# Patient Record
Sex: Male | Born: 1955 | Race: Black or African American | Hispanic: No | Marital: Married | Smoking: Never smoker
Health system: Southern US, Community
[De-identification: ages and names within clinical notes are randomized; demographics above are authoritative.]

## PROBLEM LIST (undated history)

## (undated) DIAGNOSIS — K760 Fatty (change of) liver, not elsewhere classified: Secondary | ICD-10-CM

## (undated) DIAGNOSIS — I10 Essential (primary) hypertension: Secondary | ICD-10-CM

## (undated) DIAGNOSIS — K219 Gastro-esophageal reflux disease without esophagitis: Secondary | ICD-10-CM

## (undated) DIAGNOSIS — D649 Anemia, unspecified: Secondary | ICD-10-CM

## (undated) DIAGNOSIS — E785 Hyperlipidemia, unspecified: Secondary | ICD-10-CM

## (undated) HISTORY — DX: Gastro-esophageal reflux disease without esophagitis: K21.9

## (undated) HISTORY — DX: Hyperlipidemia, unspecified: E78.5

## (undated) HISTORY — DX: Anemia, unspecified: D64.9

## (undated) HISTORY — PX: OTHER SURGICAL HISTORY: SHX169

## (undated) HISTORY — DX: Fatty (change of) liver, not elsewhere classified: K76.0

## (undated) HISTORY — DX: Essential (primary) hypertension: I10

---

## 2017-08-10 ENCOUNTER — Telehealth: Payer: Self-pay | Admitting: Gastroenterology

## 2017-08-10 NOTE — Telephone Encounter (Signed)
Received GI records from the [place]. Dr. Havery Moros is Doc of the Day for 08/09/17 AM. Patient says that he is being referred to our office for a colonoscopy because they had a hard time putting scope down his throat. Records placed on Dr. Doyne Keel desk for review.

## 2017-09-09 NOTE — Telephone Encounter (Signed)
Dr. Havery Moros reviewed records and has accepted patient. Office visit scheduled for 10/07/17

## 2017-09-30 NOTE — Progress Notes (Signed)
NP paperwork

## 2017-10-07 ENCOUNTER — Ambulatory Visit (INDEPENDENT_AMBULATORY_CARE_PROVIDER_SITE_OTHER): Payer: No Typology Code available for payment source | Admitting: Gastroenterology

## 2017-10-07 ENCOUNTER — Encounter: Payer: Self-pay | Admitting: Gastroenterology

## 2017-10-07 ENCOUNTER — Other Ambulatory Visit (INDEPENDENT_AMBULATORY_CARE_PROVIDER_SITE_OTHER): Payer: No Typology Code available for payment source

## 2017-10-07 VITALS — BP 136/96 | HR 56 | Ht 66.5 in | Wt 219.2 lb

## 2017-10-07 DIAGNOSIS — D509 Iron deficiency anemia, unspecified: Secondary | ICD-10-CM | POA: Diagnosis not present

## 2017-10-07 DIAGNOSIS — K760 Fatty (change of) liver, not elsewhere classified: Secondary | ICD-10-CM | POA: Diagnosis not present

## 2017-10-07 DIAGNOSIS — Z1211 Encounter for screening for malignant neoplasm of colon: Secondary | ICD-10-CM

## 2017-10-07 DIAGNOSIS — K219 Gastro-esophageal reflux disease without esophagitis: Secondary | ICD-10-CM | POA: Diagnosis not present

## 2017-10-07 LAB — CBC WITH DIFFERENTIAL/PLATELET
BASOS PCT: 0.6 % (ref 0.0–3.0)
Basophils Absolute: 0 10*3/uL (ref 0.0–0.1)
EOS PCT: 4.1 % (ref 0.0–5.0)
Eosinophils Absolute: 0.2 10*3/uL (ref 0.0–0.7)
HCT: 42.1 % (ref 39.0–52.0)
HEMOGLOBIN: 13.6 g/dL (ref 13.0–17.0)
LYMPHS ABS: 1.6 10*3/uL (ref 0.7–4.0)
Lymphocytes Relative: 33.4 % (ref 12.0–46.0)
MCHC: 32.4 g/dL (ref 30.0–36.0)
MCV: 88.9 fl (ref 78.0–100.0)
MONO ABS: 0.4 10*3/uL (ref 0.1–1.0)
Monocytes Relative: 8.8 % (ref 3.0–12.0)
NEUTROS ABS: 2.6 10*3/uL (ref 1.4–7.7)
Neutrophils Relative %: 53.1 % (ref 43.0–77.0)
PLATELETS: 151 10*3/uL (ref 150.0–400.0)
RBC: 4.73 Mil/uL (ref 4.22–5.81)
RDW: 14.9 % (ref 11.5–15.5)
WBC: 4.9 10*3/uL (ref 4.0–10.5)

## 2017-10-07 LAB — FERRITIN: Ferritin: 57.9 ng/mL (ref 22.0–322.0)

## 2017-10-07 MED ORDER — SUPREP BOWEL PREP KIT 17.5-3.13-1.6 GM/177ML PO SOLN: ORAL | 0 refills | Status: DC

## 2017-10-07 NOTE — Patient Instructions (Addendum)
If you are age 63 or older, your body mass index should be between 23-30. Your Body mass index is 34.86 kg/m. If this is out of the aforementioned range listed, please consider follow up with your Primary Care Provider.  If you are age 61 or younger, your body mass index should be between 19-25. Your Body mass index is 34.86 kg/m. If this is out of the aformentioned range listed, please consider follow up with your Primary Care Provider.   You have been scheduled for a colonoscopy. Please follow written instructions given to you at your visit today.  Please pick up your prep supplies at the pharmacy within the next 1-3 days. If you use inhalers (even only as needed), please bring them with you on the day of your procedure. Your physician has requested that you go to www.startemmi.com and enter the access code given to you at your visit today. This web site gives a general overview about your procedure. However, you should still follow specific instructions given to you by our office regarding your preparation for the procedure.   Please go to the lab in the basement of our building to have lab work done as you leave today.   Thank you for entrusting me with your care and for choosing [facility], Dr. [place] Cellar

## 2017-10-07 NOTE — Progress Notes (Signed)
HPI :  62 year old male with a history of GERD, hypertension, hyperlipidemia, fatty liver, referred here by the [facility] for consideration of colonoscopy in light of the patient's anemia.  I don't have any access to prior labs done for this patient however it is reported that he has an iron deficiency ongoing for at least a few years. He denies any blood in his stools. He denies any bowel changes, he states his regular bowel movements without constipation or diarrhea. He denies any abdominal pains. He thinks his uncle may have had colon cancer but is not sure. Otherwise no known colon cancer in the family. He is never had a colonoscopy but has been screened with FIT stool test by the [place]. I don't have any results of prior FIT results.  He does have a long-standing history of heartburn. He is currently on pantoprazole 40 mg which currently controls his symptoms quite well. His prescription is for twice daily dosing however he states he does not take it twice a day, he will often take it only once per day. He denies any dysphagia at present although has had this in the past. He had an upper endoscopy performed in April of this year as outlined below, he had an irregular Z line - biopsies taken for which we don't have the results, he is not aware of any diagnosis of Barrett's esophagus or need to follow up endoscopically.  He otherwise was noted to have a CT scan in recent months showing a 3 cm liver lesion. His LFTs have been normal. He had a follow-up MRI at the [place] done in March 2019 which showed diffuse hepatic steatosis with focal area of marked fatty infiltration in the left lobe causing CT findings. He denies any history of alcohol use. He states his weight is stable around 215-220 pounds. He denies any family history of liver disease.  Endoscopic history: EGD 07/20/2017 - irregular z-line, small HH, normal stomach and duodenum   Past Medical History:  Diagnosis Date  . Anemia   . Fatty liver   . GERD  (gastroesophageal reflux disease)   . HLD (hyperlipidemia)   . HTN (hypertension)      Past Surgical History:  Procedure Laterality Date  . Tooth Implants      Family History  Problem Relation Age of Onset  . Cancer Mother        unknown type  . Diabetes Sister   . Asthma Sister   . Liver disease Sister   . Colon cancer Maternal Aunt    Social History   Tobacco Use  . Smoking status: Never Smoker  . Smokeless tobacco: Never Used  Substance Use Topics  . Alcohol use: Not Currently  . Drug use: Not on file   Current Outpatient Medications  Medication Sig Dispense Refill  . aspirin EC 81 MG tablet Take 81 mg by mouth daily.    Marland Kitchen atenolol (TENORMIN) 100 MG tablet Take 100 mg by mouth daily.    Marland Kitchen atorvastatin (LIPITOR) 20 MG tablet Take 20 mg by mouth at bedtime.    . chlorhexidine (PERIDEX) 0.12 % solution Use as directed 15 mLs in the mouth or throat 2 (two) times daily.    . enalapril (VASOTEC) 20 MG tablet Take 20 mg by mouth daily.    . pantoprazole (PROTONIX) 40 MG tablet Take 40 mg by mouth 2 (two) times daily before a meal.    . sildenafil (VIAGRA) 50 MG tablet Take 25 mg by mouth daily  as needed for erectile dysfunction.    Marland Kitchen terazosin (HYTRIN) 10 MG capsule Take 10 mg by mouth at bedtime.    Manus Gunning BOWEL PREP KIT 17.5-3.13-1.6 GM/177ML SOLN Suprep-Use as directed 354 mL 0   No current facility-administered medications for this visit.    Allergies no known allergies   Review of Systems: All systems reviewed and negative except where noted in HPI.    No results in Epic  Physical Exam: BP (!) 136/96 (BP Location: Left Arm, Patient Position: Sitting, Cuff Size: Large)   Pulse (!) 56   Ht 5' 6.5" (1.689 m) Comment: height measured without shoes  Wt 219 lb 4 oz (99.5 kg)   BMI 34.86 kg/m  Constitutional: Pleasant,well-developed, male in no acute distress. HEENT: Normocephalic and atraumatic. Conjunctivae are normal. No scleral icterus. Neck supple.    Cardiovascular: Normal rate, regular rhythm.  Pulmonary/chest: Effort normal and breath sounds normal. No wheezing, rales or rhonchi. Abdominal: Soft, nondistended, nontender.  There are no masses palpable. No hepatomegaly. Extremities: no edema Lymphadenopathy: No cervical adenopathy noted. Neurological: Alert and oriented to person place and time. Skin: Skin is warm and dry. No rashes noted. Psychiatric: Normal mood and affect. Behavior is normal.   ASSESSMENT AND PLAN: 62 year old male with medical history as outlined above, [facility] the following issues:  Reported iron deficiency / colon cancer screening - [facility], report of iron deficiency anemia ongoing for a few years. I don't see any prior labs so I don't know the severity of this. He's had a relatively recent upper endoscopy without any concerning pathology noted. He has never had a prior colonoscopy, and is asymptomatic. In light of his reported anemia, recommend optical colonoscopy to further evaluate. I discussed the risks and benefits of colonoscopy and anesthesia with him, and he wanted to proceed. Further recommendations pending this result. In the interim I asked him to go to the lab to check CBC and iron studies to clarify severity of his anemia and if he warrants iron supplementation. He agreed the plan  GERD - recent EGD, regular Z line noted but don't have results of pathology, he [facility] to confirm he does not have Barrett's esophagus. Otherwise continue low-dose dose of PPI needed to control his symptoms.  Fatty liver - diffuse steatosis noted on CT and MRI. His LFTs are normal. I counseled him this is a risk factor for Karlene Lineman and cirrhosis. He does not drink any alcohol. He should have his liver function checked yearly and focus on weight loss through diet and exercise to minimize his risk of developing liver disease. He agreed.  [place] Cellar, MD [facility]  Gastroenterology

## 2017-10-08 ENCOUNTER — Telehealth: Payer: Self-pay

## 2017-10-08 ENCOUNTER — Telehealth: Payer: Self-pay | Admitting: Gastroenterology

## 2017-10-08 LAB — IRON AND TIBC
Iron Saturation: 39 % (ref 15–55)
Iron: 112 ug/dL (ref 38–169)
Total Iron Binding Capacity: 286 ug/dL (ref 250–450)
UIBC: 174 ug/dL (ref 111–343)

## 2017-10-08 MED ORDER — SUPREP BOWEL PREP KIT 17.5-3.13-1.6 GM/177ML PO SOLN: ORAL | 0 refills | Status: DC

## 2017-10-08 NOTE — Telephone Encounter (Signed)
Sent to [facility] on [address]

## 2017-10-08 NOTE — Telephone Encounter (Signed)
-----   Message from Dow Adolph sent at 10/07/2017  4:29 PM EDT ----- Pt needs his script [facility] to [facility] on [address]

## 2017-10-08 NOTE — Telephone Encounter (Signed)
  Lm for pt that I was returning his call PN:[id number]

## 2017-10-11 ENCOUNTER — Other Ambulatory Visit: Payer: Self-pay

## 2017-10-11 ENCOUNTER — Telehealth: Payer: Self-pay | Admitting: Gastroenterology

## 2017-10-11 NOTE — Telephone Encounter (Signed)
See lab results.  No Rx needed

## 2017-10-11 NOTE — Telephone Encounter (Signed)
Spoke to [place], they wanted to clarify if patient was supposed to be also taking an iron supplement. Let her know that this will be based on his lab results. She states that if he does need a prescription to either fax it to their pharmacy at: [phone] or have patient come pick up/mail a written prescription to him. If he uses their pharmacy then he will receive it free of charge.

## 2017-10-11 NOTE — Telephone Encounter (Signed)
Pharmacy calling to inform that suprep is not cover. Insurance cover either moviprep or golytely.

## 2017-10-11 NOTE — Progress Notes (Signed)
Called the [place]. Can't speak to the pharmacy; only veterans can. Spoke to representative: they will not cover any brand prescriptions, including Suprep.  They want him to use something else like Golytely or MoviPrep, which he would not have to pay for.   Called and spoke to pt. Explained to him that Dr. Loni Muse uses Suprep b/c it allows him to visualize the colon better.  I told him I will leave a sample for him at the front desk.  He said he will pick it up tomorrow.  Also indicated he needs a letter about his office visit with Dr. Loni Muse on the 20th for reimbursement for travel. Letter printed and placed with Suprep sample to be picked up.

## 2017-10-11 NOTE — Telephone Encounter (Signed)
Left message to call back  

## 2017-10-18 NOTE — Telephone Encounter (Signed)
Camille from pharmacy calling in regarding this. Best # [phone] ext. [place]

## 2017-10-18 NOTE — Telephone Encounter (Signed)
Called Pharmacy.Cancelled RX.  Gave pt a  Sample last week

## 2017-11-22 ENCOUNTER — Encounter: Payer: Self-pay | Admitting: Gastroenterology

## 2017-11-24 ENCOUNTER — Encounter: Payer: Self-pay | Admitting: Gastroenterology

## 2017-11-24 ENCOUNTER — Ambulatory Visit (AMBULATORY_SURGERY_CENTER): Payer: No Typology Code available for payment source | Admitting: Gastroenterology

## 2017-11-24 VITALS — BP 158/95 | HR 60 | Temp 98.7°F | Resp 18 | Ht 66.5 in | Wt 219.2 lb

## 2017-11-24 DIAGNOSIS — Z1211 Encounter for screening for malignant neoplasm of colon: Secondary | ICD-10-CM

## 2017-11-24 DIAGNOSIS — D120 Benign neoplasm of cecum: Secondary | ICD-10-CM | POA: Diagnosis not present

## 2017-11-24 DIAGNOSIS — D509 Iron deficiency anemia, unspecified: Secondary | ICD-10-CM

## 2017-11-24 MED ORDER — SODIUM CHLORIDE 0.9 % IV SOLN: 500.0000 mL | Freq: Once | INTRAVENOUS | Status: DC

## 2017-11-24 NOTE — Patient Instructions (Signed)
YOU HAD AN ENDOSCOPIC PROCEDURE TODAY AT THE [facility]:   Refer to the procedure report that was given to you for any specific questions about what was found during the examination.  If the procedure report does not answer your questions, please call your gastroenterologist to clarify.  If you requested that your care partner not be given the details of your procedure findings, then the procedure report has been included in a sealed envelope for you to review at your convenience later.  YOU SHOULD EXPECT: Some feelings of bloating in the abdomen. Passage of more gas than usual.  Walking can help get rid of the air that was put into your GI tract during the procedure and reduce the bloating. If you had a lower endoscopy (such as a colonoscopy or flexible sigmoidoscopy) you may notice spotting of blood in your stool or on the toilet paper. If you underwent a bowel prep for your procedure, you may not have a normal bowel movement for a few days.  Please Note:  You might notice some irritation and congestion in your nose or some drainage.  This is from the oxygen used during your procedure.  There is no need for concern and it should clear up in a day or so.  SYMPTOMS TO REPORT IMMEDIATELY:   Following lower endoscopy (colonoscopy or flexible sigmoidoscopy):  Excessive amounts of blood in the stool  Significant tenderness or worsening of abdominal pains  Swelling of the abdomen that is new, acute  Fever of 100F or higher   For urgent or emergent issues, a gastroenterologist can be reached at any hour by calling [phone].   DIET:  We do recommend a small meal at first, but then you may proceed to your regular diet.  Drink plenty of fluids but you should avoid alcoholic beverages for 24 hours.  Try to increase the fiber in your diet, and drink plenty of water.  ACTIVITY:  You should plan to take it easy for the rest of today and you should NOT DRIVE or use heavy machinery until  tomorrow (because of the sedation medicines used during the test).    FOLLOW UP: Our staff will call the number listed on your records the next business day following your procedure to check on you and address any questions or concerns that you may have regarding the information given to you following your procedure. If we do not reach you, we will leave a message.  However, if you are feeling well and you are not experiencing any problems, there is no need to return our call.  We will assume that you have returned to your regular daily activities without incident.  If any biopsies were taken you will be contacted by phone or by letter within the next 1-3 weeks.  Please call us at [phone] if you have not heard about the biopsies in 3 weeks.    SIGNATURES/CONFIDENTIALITY: You and/or your care partner have signed paperwork which will be entered into your electronic medical record.  These signatures attest to the fact that that the information above on your After Visit Summary has been reviewed and is understood.  Full responsibility of the confidentiality of this discharge information lies with you and/or your care-partner. 

## 2017-11-24 NOTE — Progress Notes (Signed)
Called to room to assist during endoscopic procedure.  Patient ID and intended procedure confirmed with present staff. Received instructions for my participation in the procedure from the performing physician.  

## 2017-11-24 NOTE — Progress Notes (Signed)
Pt's states no medical or surgical changes since previsit or office visit. 

## 2017-11-24 NOTE — Op Note (Signed)
[place] Patient Name: Russell Prince Procedure Date: 11/24/2017 4:48 PM MRN: [id number] Endoscopist: Remo Lipps P. Havery Moros , MD Age: 62 Referring MD:  Date of Birth: Mar 11, 1956 Gender: Male Account #: [id number] Procedure:                Colonoscopy Indications:              This is the patient's first colonoscopy, history of                            iron deficiency anemia - resolved on most recent                            labs, prior EGD without cause Medicines:                Monitored Anesthesia Care Procedure:                Pre-Anesthesia Assessment:                           - Prior to the procedure, a History and Physical                            was performed, and patient medications and                            allergies were reviewed. The patient's tolerance of                            previous anesthesia was also reviewed. The risks                            and benefits of the procedure and the sedation                            options and risks were discussed with the patient.                            All questions were answered, and informed consent                            was obtained. Prior Anticoagulants: The patient has                            taken no previous anticoagulant or antiplatelet                            agents. ASA Grade Assessment: II - A patient with                            mild systemic disease. After reviewing the risks                            and benefits, the patient was deemed in  satisfactory condition to undergo the procedure.                           After obtaining informed consent, the colonoscope                            was passed under direct vision. Throughout the                            procedure, the patient's blood pressure, pulse, and                            oxygen saturations were monitored continuously. The                            Colonoscope was  introduced through the anus and                            advanced to the the terminal ileum, with                            identification of the appendiceal orifice and IC                            valve. The colonoscopy was performed without                            difficulty. The patient tolerated the procedure                            well. The quality of the bowel preparation was                            good. The ileocecal valve, appendiceal orifice, and                            rectum were photographed. Scope In: 4:55:20 PM Scope Out: 5:19:03 PM Scope Withdrawal Time: 0 hours 19 minutes 56 seconds  Total Procedure Duration: 0 hours 23 minutes 43 seconds  Findings:                 The perianal and digital rectal examinations were                            normal.                           The terminal ileum appeared normal.                           A 3 mm polyp was found in the cecum. The polyp was                            sessile. The polyp was removed with a cold snare.  Resection and retrieval were complete.                           A few medium-mouthed diverticula were found in the                            ascending colon.                           Internal hemorrhoids were found during retroflexion.                           The colon was tortous. The exam was otherwise                            without abnormality. Complications:            No immediate complications. Estimated blood loss:                            Minimal. Estimated Blood Loss:     Estimated blood loss was minimal. Impression:               - The examined portion of the ileum was normal.                           - One 3 mm polyp in the cecum, removed with a cold                            snare. Resected and retrieved.                           - Diverticulosis in the ascending colon.                           - Internal hemorrhoids.                            - Tortous colon                           - The examination was otherwise normal. Recommendation:           - Patient has a contact number available for                            emergencies. The signs and symptoms of potential                            delayed complications were discussed with the                            patient. Return to normal activities tomorrow.                            Written discharge instructions were provided to the  patient.                           - Resume previous diet.                           - Continue present medications.                           - Await pathology results.                           - Repeat colonoscopy for surveillance based on                            pathology results. Remo Lipps P. [place], MD 11/24/2017 5:24:06 PM This report has been signed electronically.

## 2017-11-25 ENCOUNTER — Telehealth: Payer: Self-pay

## 2017-11-25 NOTE — Telephone Encounter (Signed)
  Follow up Call-  Call back number 11/24/2017  Post procedure Call Back phone  # [phone]  Permission to leave phone message Yes     Patient questions:  Do you have a fever, pain , or abdominal swelling? No. Pain Score  0 *  Have you tolerated food without any problems? Yes.    Have you been able to return to your normal activities? Yes.    Do you have any questions about your discharge instructions: Diet   No. Medications  No. Follow up visit  No.  Do you have questions or concerns about your Care? No.  Actions: * If pain score is 4 or above: No action needed, pain <4.

## 2017-12-27 ENCOUNTER — Encounter: Payer: Self-pay | Admitting: Family Medicine

## 2018-05-02 ENCOUNTER — Emergency Department (HOSPITAL_COMMUNITY)
Admission: EM | Admit: 2018-05-02 | Discharge: 2018-05-03 | Disposition: A | Discharge status: Home / Self Care | Payer: Non-veteran care | Attending: Emergency Medicine | Admitting: Emergency Medicine

## 2018-05-02 ENCOUNTER — Other Ambulatory Visit: Payer: Self-pay

## 2018-05-02 ENCOUNTER — Encounter (HOSPITAL_COMMUNITY): Payer: Self-pay

## 2018-05-02 DIAGNOSIS — R1011 Right upper quadrant pain: Secondary | ICD-10-CM | POA: Insufficient documentation

## 2018-05-02 DIAGNOSIS — Z5321 Procedure and treatment not carried out due to patient leaving prior to being seen by health care provider: Secondary | ICD-10-CM | POA: Insufficient documentation

## 2018-05-02 LAB — URINALYSIS, ROUTINE W REFLEX MICROSCOPIC
BILIRUBIN URINE: NEGATIVE
GLUCOSE, UA: NEGATIVE mg/dL
Hgb urine dipstick: NEGATIVE
KETONES UR: NEGATIVE mg/dL
LEUKOCYTES UA: NEGATIVE
NITRITE: NEGATIVE
PH: 5 (ref 5.0–8.0)
Protein, ur: NEGATIVE mg/dL
SPECIFIC GRAVITY, URINE: 1.016 (ref 1.005–1.030)

## 2018-05-02 NOTE — ED Triage Notes (Signed)
Pt states he has been having right sided flank pain for approximately 2 weeks.  No issues with urination.

## 2018-05-02 NOTE — ED Notes (Signed)
Pt called to be placed in room. No answer.

## 2018-05-03 NOTE — ED Notes (Signed)
Called for room x3 no answer °

## 2018-05-03 NOTE — ED Notes (Signed)
Called to be placed. No answer

## 2018-05-08 ENCOUNTER — Encounter (HOSPITAL_COMMUNITY): Payer: Self-pay | Admitting: Emergency Medicine

## 2018-05-08 ENCOUNTER — Emergency Department (HOSPITAL_COMMUNITY)
Admission: EM | Admit: 2018-05-08 | Discharge: 2018-05-08 | Disposition: A | Discharge status: Home / Self Care | Payer: No Typology Code available for payment source | Attending: Emergency Medicine | Admitting: Emergency Medicine

## 2018-05-08 ENCOUNTER — Emergency Department (HOSPITAL_COMMUNITY): Payer: No Typology Code available for payment source

## 2018-05-08 DIAGNOSIS — Y939 Activity, unspecified: Secondary | ICD-10-CM | POA: Diagnosis not present

## 2018-05-08 DIAGNOSIS — Z7982 Long term (current) use of aspirin: Secondary | ICD-10-CM | POA: Insufficient documentation

## 2018-05-08 DIAGNOSIS — T148XXA Other injury of unspecified body region, initial encounter: Secondary | ICD-10-CM

## 2018-05-08 DIAGNOSIS — E785 Hyperlipidemia, unspecified: Secondary | ICD-10-CM | POA: Diagnosis not present

## 2018-05-08 DIAGNOSIS — Y33XXXA Other specified events, undetermined intent, initial encounter: Secondary | ICD-10-CM | POA: Insufficient documentation

## 2018-05-08 DIAGNOSIS — Y998 Other external cause status: Secondary | ICD-10-CM | POA: Insufficient documentation

## 2018-05-08 DIAGNOSIS — Z79899 Other long term (current) drug therapy: Secondary | ICD-10-CM | POA: Diagnosis not present

## 2018-05-08 DIAGNOSIS — R1084 Generalized abdominal pain: Secondary | ICD-10-CM | POA: Diagnosis present

## 2018-05-08 DIAGNOSIS — Y929 Unspecified place or not applicable: Secondary | ICD-10-CM | POA: Diagnosis not present

## 2018-05-08 DIAGNOSIS — I10 Essential (primary) hypertension: Secondary | ICD-10-CM | POA: Insufficient documentation

## 2018-05-08 DIAGNOSIS — S39012A Strain of muscle, fascia and tendon of lower back, initial encounter: Secondary | ICD-10-CM | POA: Insufficient documentation

## 2018-05-08 LAB — URINALYSIS, ROUTINE W REFLEX MICROSCOPIC
Bilirubin Urine: NEGATIVE
GLUCOSE, UA: NEGATIVE mg/dL
Hgb urine dipstick: NEGATIVE
KETONES UR: NEGATIVE mg/dL
Leukocytes, UA: NEGATIVE
NITRITE: NEGATIVE
PH: 6 (ref 5.0–8.0)
PROTEIN: NEGATIVE mg/dL
Specific Gravity, Urine: 1.006 (ref 1.005–1.030)

## 2018-05-08 MED ORDER — MORPHINE SULFATE (PF) 4 MG/ML IV SOLN
6.0000 mg | Freq: Once | INTRAVENOUS | Status: AC
  Administered 2018-05-08: 6 mg via INTRAMUSCULAR
  Filled 2018-05-08: qty 2

## 2018-05-08 MED ORDER — METHOCARBAMOL 750 MG PO TABS: 750.0000 mg | ORAL_TABLET | Freq: Four times a day (QID) | ORAL | 0 refills | Status: DC

## 2018-05-08 MED ORDER — HYDROCODONE-ACETAMINOPHEN 5-325 MG PO TABS: 2.0000 | ORAL_TABLET | ORAL | 0 refills | Status: DC | PRN

## 2018-05-08 MED ORDER — DIAZEPAM 5 MG PO TABS
10.0000 mg | ORAL_TABLET | Freq: Once | ORAL | Status: AC
Start: 2018-05-08 — End: 2018-05-08
  Administered 2018-05-08: 10 mg via ORAL
  Filled 2018-05-08: qty 2

## 2018-05-08 NOTE — ED Notes (Signed)
Patient transported to CT 

## 2018-05-08 NOTE — ED Triage Notes (Signed)
Pt here having right side flank pain /low back pain for 2 weeks

## 2018-05-08 NOTE — ED Provider Notes (Signed)
[facility] EMERGENCY DEPARTMENT Provider Note   CSN: [id number] Arrival date & time: 05/08/18  1315     History   Chief Complaint No chief complaint on file.   HPI Russell Prince is a 63 y.o. male.  63 year old male presents with 2 weeks of right-sided flank pain characterizes dull and worse with movement.  Denies any radicular symptoms down his right leg.  No bowel or bladder dysfunction.  Denies any dysuria or hematuria.  No fever or chills.  No nausea vomiting.  Pain is sharp and better with remaining still.  Has used over-the-counter medications without relief.  Denies any rashes to the skin.     Past Medical History:  Diagnosis Date  . Anemia   . Fatty liver   . GERD (gastroesophageal reflux disease)   . HLD (hyperlipidemia)   . HTN (hypertension)     There are no active problems to display for this patient.   Past Surgical History:  Procedure Laterality Date  . Tooth Implants           Home Medications    Prior to Admission medications   Medication Sig Start Date End Date Taking? Authorizing Provider  aspirin EC 81 MG tablet Take 81 mg by mouth daily.    [provider]  atenolol (TENORMIN) 100 MG tablet Take 100 mg by mouth daily.    [provider]  atorvastatin (LIPITOR) 20 MG tablet Take 20 mg by mouth at bedtime.    [provider]  chlorhexidine (PERIDEX) 0.12 % solution Use as directed 15 mLs in the mouth or throat 2 (two) times daily.    [provider]  enalapril (VASOTEC) 20 MG tablet Take 20 mg by mouth daily.    [provider]  pantoprazole (PROTONIX) 40 MG tablet Take 40 mg by mouth 2 (two) times daily before a meal.    [provider]  sildenafil (VIAGRA) 50 MG tablet Take 25 mg by mouth daily as needed for erectile dysfunction.    [provider]  terazosin (HYTRIN) 10 MG capsule Take 10 mg by mouth at bedtime.    [provider]    Family  History Family History  Problem Relation Age of Onset  . Cancer Mother        unknown type  . Diabetes Sister   . Asthma Sister   . Liver disease Sister   . Colon cancer Maternal Uncle   . Stomach cancer Neg Hx   . Esophageal cancer Neg Hx     Social History Social History   Tobacco Use  . Smoking status: Never Smoker  . Smokeless tobacco: Never Used  Substance Use Topics  . Alcohol use: Never    Frequency: Never  . Drug use: Never     Allergies   Patient has no known allergies.   Review of Systems Review of Systems  All other systems reviewed and are negative.    Physical Exam Updated Vital Signs BP (!) 187/104 (BP Location: Right Arm)   Pulse (!) 50   Temp 97.8 F (36.6 C) (Oral)   Resp 16   SpO2 99%   Physical Exam Vitals signs and nursing note reviewed.  Constitutional:      General: He is not in acute distress.    Appearance: Normal appearance. He is well-developed. He is not toxic-appearing.  HENT:     Head: Normocephalic and atraumatic.  Eyes:     General: Lids are normal.  Conjunctiva/sclera: Conjunctivae normal.     Pupils: Pupils are equal, round, and reactive to light.  Neck:     Musculoskeletal: Normal range of motion and neck supple.     Thyroid: No thyroid mass.     Trachea: No tracheal deviation.  Cardiovascular:     Rate and Rhythm: Normal rate and regular rhythm.     Heart sounds: Normal heart sounds. No murmur. No gallop.   Pulmonary:     Effort: Pulmonary effort is normal. No respiratory distress.     Breath sounds: Normal breath sounds. No stridor. No decreased breath sounds, wheezing, rhonchi or rales.  Abdominal:     General: Bowel sounds are normal. There is no distension.     Palpations: Abdomen is soft.     Tenderness: There is no abdominal tenderness. There is no rebound.  Musculoskeletal: Normal range of motion.     Lumbar back: He exhibits tenderness. He exhibits no bony tenderness and no swelling.        Back:  Skin:    General: Skin is warm and dry.     Findings: No abrasion or rash.  Neurological:     Mental Status: He is alert and oriented to person, place, and time.     GCS: GCS eye subscore is 4. GCS verbal subscore is 5. GCS motor subscore is 6.     Cranial Nerves: No cranial nerve deficit.     Sensory: No sensory deficit.  Psychiatric:        Speech: Speech normal.        Behavior: Behavior normal.      ED Treatments / Results  Labs (all labs ordered are listed, but only abnormal results are displayed) Labs Reviewed  URINALYSIS, ROUTINE W REFLEX MICROSCOPIC    EKG None  Radiology No results found.  Procedures Procedures (including critical care time)  Medications Ordered in ED Medications  diazepam (VALIUM) tablet 10 mg (has no administration in time range)  morphine 4 MG/ML injection 6 mg (has no administration in time range)     Initial Impression / Assessment and Plan / ED Course  I have reviewed the triage vital signs and the nursing notes.  Pertinent labs & imaging results that were available during my care of the patient were reviewed by me and considered in my medical decision making (see chart for details).     Patient medicated for pain here and now feels better.  Urinalysis negative.  CT scan negative.  Suspect musculoskeletal strain and patient stable for discharge  Final Clinical Impressions(s) / ED Diagnoses   Final diagnoses:  None    ED Discharge Orders    None       Lacretia Leigh, MD 05/08/18 1436

## 2018-05-31 ENCOUNTER — Telehealth: Payer: Self-pay

## 2018-05-31 DIAGNOSIS — D509 Iron deficiency anemia, unspecified: Secondary | ICD-10-CM

## 2018-05-31 NOTE — Progress Notes (Signed)
Pt due for labs - CBC for IDA

## 2018-05-31 NOTE — Telephone Encounter (Signed)
-----   Message from Roetta Sessions, Brielle sent at 12/01/2017  4:25 PM EDT ----- Regarding: cbc due in Feb Cbc due in Feb 2020. For IDA

## 2018-05-31 NOTE — Telephone Encounter (Signed)
Lm for pt that it he needs to go to the lab for CBC to monitor IDA.  Order is in

## 2018-06-01 NOTE — Telephone Encounter (Signed)
Letter mailed to pt to go to the lab for CBC

## 2018-06-06 ENCOUNTER — Other Ambulatory Visit (INDEPENDENT_AMBULATORY_CARE_PROVIDER_SITE_OTHER): Payer: Non-veteran care

## 2018-06-06 DIAGNOSIS — D509 Iron deficiency anemia, unspecified: Secondary | ICD-10-CM

## 2018-06-06 LAB — CBC WITH DIFFERENTIAL/PLATELET
Basophils Absolute: 0 10*3/uL (ref 0.0–0.1)
Basophils Relative: 0.4 % (ref 0.0–3.0)
EOS PCT: 3.7 % (ref 0.0–5.0)
Eosinophils Absolute: 0.2 10*3/uL (ref 0.0–0.7)
HCT: 44.2 % (ref 39.0–52.0)
Hemoglobin: 14.8 g/dL (ref 13.0–17.0)
Lymphocytes Relative: 23.9 % (ref 12.0–46.0)
Lymphs Abs: 1.4 10*3/uL (ref 0.7–4.0)
MCHC: 33.4 g/dL (ref 30.0–36.0)
MCV: 87.6 fl (ref 78.0–100.0)
Monocytes Absolute: 0.7 10*3/uL (ref 0.1–1.0)
Monocytes Relative: 12.3 % — ABNORMAL HIGH (ref 3.0–12.0)
NEUTROS ABS: 3.6 10*3/uL (ref 1.4–7.7)
Neutrophils Relative %: 59.7 % (ref 43.0–77.0)
Platelets: 166 10*3/uL (ref 150.0–400.0)
RBC: 5.05 Mil/uL (ref 4.22–5.81)
RDW: 14 % (ref 11.5–15.5)
WBC: 6 10*3/uL (ref 4.0–10.5)

## 2018-06-07 ENCOUNTER — Encounter: Payer: Self-pay | Admitting: Gastroenterology

## 2019-04-30 ENCOUNTER — Emergency Department (HOSPITAL_COMMUNITY): Payer: No Typology Code available for payment source

## 2019-04-30 ENCOUNTER — Inpatient Hospital Stay (HOSPITAL_COMMUNITY)
Admission: EM | Admit: 2019-04-30 | Discharge: 2019-05-03 | DRG: 177 | Disposition: A | Discharge status: Home / Self Care | Payer: No Typology Code available for payment source | Attending: Family Medicine | Admitting: Family Medicine

## 2019-04-30 ENCOUNTER — Other Ambulatory Visit: Payer: Self-pay

## 2019-04-30 ENCOUNTER — Encounter (HOSPITAL_COMMUNITY): Payer: Self-pay | Admitting: Emergency Medicine

## 2019-04-30 DIAGNOSIS — K219 Gastro-esophageal reflux disease without esophagitis: Secondary | ICD-10-CM | POA: Diagnosis present

## 2019-04-30 DIAGNOSIS — Z79899 Other long term (current) drug therapy: Secondary | ICD-10-CM | POA: Diagnosis not present

## 2019-04-30 DIAGNOSIS — K760 Fatty (change of) liver, not elsewhere classified: Secondary | ICD-10-CM | POA: Diagnosis present

## 2019-04-30 DIAGNOSIS — R0602 Shortness of breath: Secondary | ICD-10-CM

## 2019-04-30 DIAGNOSIS — U071 COVID-19: Principal | ICD-10-CM

## 2019-04-30 DIAGNOSIS — E785 Hyperlipidemia, unspecified: Secondary | ICD-10-CM | POA: Diagnosis present

## 2019-04-30 DIAGNOSIS — J1282 Pneumonia due to coronavirus disease 2019: Secondary | ICD-10-CM | POA: Diagnosis present

## 2019-04-30 DIAGNOSIS — N400 Enlarged prostate without lower urinary tract symptoms: Secondary | ICD-10-CM | POA: Diagnosis present

## 2019-04-30 DIAGNOSIS — N179 Acute kidney failure, unspecified: Secondary | ICD-10-CM | POA: Diagnosis present

## 2019-04-30 DIAGNOSIS — I160 Hypertensive urgency: Secondary | ICD-10-CM | POA: Diagnosis present

## 2019-04-30 DIAGNOSIS — J9601 Acute respiratory failure with hypoxia: Secondary | ICD-10-CM | POA: Diagnosis present

## 2019-04-30 DIAGNOSIS — K5900 Constipation, unspecified: Secondary | ICD-10-CM | POA: Diagnosis present

## 2019-04-30 DIAGNOSIS — E876 Hypokalemia: Secondary | ICD-10-CM | POA: Diagnosis present

## 2019-04-30 DIAGNOSIS — Z7982 Long term (current) use of aspirin: Secondary | ICD-10-CM | POA: Diagnosis not present

## 2019-04-30 DIAGNOSIS — R7401 Elevation of levels of liver transaminase levels: Secondary | ICD-10-CM

## 2019-04-30 DIAGNOSIS — I10 Essential (primary) hypertension: Secondary | ICD-10-CM | POA: Diagnosis present

## 2019-04-30 LAB — URINALYSIS, ROUTINE W REFLEX MICROSCOPIC
Bacteria, UA: NONE SEEN
Bilirubin Urine: NEGATIVE
Glucose, UA: NEGATIVE mg/dL
Hgb urine dipstick: NEGATIVE
Ketones, ur: 20 mg/dL — AB
Leukocytes,Ua: NEGATIVE
Nitrite: NEGATIVE
Protein, ur: 100 mg/dL — AB
Specific Gravity, Urine: 1.019 (ref 1.005–1.030)
pH: 6 (ref 5.0–8.0)

## 2019-04-30 LAB — FIBRINOGEN: Fibrinogen: 657 mg/dL — ABNORMAL HIGH (ref 210–475)

## 2019-04-30 LAB — LACTIC ACID, PLASMA: Lactic Acid, Venous: 1.3 mmol/L (ref 0.5–1.9)

## 2019-04-30 LAB — COMPREHENSIVE METABOLIC PANEL
ALT: 22 U/L (ref 0–44)
AST: 45 U/L — ABNORMAL HIGH (ref 15–41)
Albumin: 3.1 g/dL — ABNORMAL LOW (ref 3.5–5.0)
Alkaline Phosphatase: 66 U/L (ref 38–126)
Anion gap: 10 (ref 5–15)
BUN: 9 mg/dL (ref 8–23)
CO2: 29 mmol/L (ref 22–32)
Calcium: 8.7 mg/dL — ABNORMAL LOW (ref 8.9–10.3)
Chloride: 99 mmol/L (ref 98–111)
Creatinine, Ser: 1.48 mg/dL — ABNORMAL HIGH (ref 0.61–1.24)
GFR calc Af Amer: 57 mL/min — ABNORMAL LOW (ref >60)
GFR calc non Af Amer: 49 mL/min — ABNORMAL LOW (ref >60)
Glucose, Bld: 105 mg/dL — ABNORMAL HIGH (ref 70–99)
Potassium: 3.4 mmol/L — ABNORMAL LOW (ref 3.5–5.1)
Sodium: 138 mmol/L (ref 135–145)
Total Bilirubin: 0.7 mg/dL (ref 0.3–1.2)
Total Protein: 7.6 g/dL (ref 6.5–8.1)

## 2019-04-30 LAB — CBC WITH DIFFERENTIAL/PLATELET
Abs Immature Granulocytes: 0.02 10*3/uL (ref 0.00–0.07)
Basophils Absolute: 0 10*3/uL (ref 0.0–0.1)
Basophils Relative: 0 %
Eosinophils Absolute: 0 10*3/uL (ref 0.0–0.5)
Eosinophils Relative: 1 %
HCT: 48.2 % (ref 39.0–52.0)
Hemoglobin: 15.4 g/dL (ref 13.0–17.0)
Immature Granulocytes: 1 %
Lymphocytes Relative: 21 %
Lymphs Abs: 0.8 10*3/uL (ref 0.7–4.0)
MCH: 28.3 pg (ref 26.0–34.0)
MCHC: 32 g/dL (ref 30.0–36.0)
MCV: 88.4 fL (ref 80.0–100.0)
Monocytes Absolute: 0.4 10*3/uL (ref 0.1–1.0)
Monocytes Relative: 9 %
Neutro Abs: 2.8 10*3/uL (ref 1.7–7.7)
Neutrophils Relative %: 68 %
Platelets: 199 10*3/uL (ref 150–400)
RBC: 5.45 MIL/uL (ref 4.22–5.81)
RDW: 13.5 % (ref 11.5–15.5)
WBC: 4.1 10*3/uL (ref 4.0–10.5)
nRBC: 0 % (ref 0.0–0.2)

## 2019-04-30 LAB — TROPONIN I (HIGH SENSITIVITY)
Troponin I (High Sensitivity): 10 ng/L (ref <18)
Troponin I (High Sensitivity): 10 ng/L (ref <18)

## 2019-04-30 LAB — TYPE AND SCREEN
ABO/RH(D): O POS
Antibody Screen: NEGATIVE

## 2019-04-30 LAB — HIV ANTIBODY (ROUTINE TESTING W REFLEX): HIV Screen 4th Generation wRfx: NONREACTIVE

## 2019-04-30 LAB — RESPIRATORY PANEL BY RT PCR (FLU A&B, COVID)
Influenza A by PCR: NEGATIVE
Influenza B by PCR: NEGATIVE
SARS Coronavirus 2 by RT PCR: POSITIVE — AB

## 2019-04-30 LAB — C-REACTIVE PROTEIN: CRP: 15.9 mg/dL — ABNORMAL HIGH (ref <1.0)

## 2019-04-30 LAB — LIPASE, BLOOD: Lipase: 25 U/L (ref 11–51)

## 2019-04-30 LAB — POC SARS CORONAVIRUS 2 AG -  ED: SARS Coronavirus 2 Ag: NEGATIVE

## 2019-04-30 LAB — TRIGLYCERIDES: Triglycerides: 99 mg/dL (ref <150)

## 2019-04-30 LAB — PROCALCITONIN: Procalcitonin: <0.1 ng/mL

## 2019-04-30 LAB — FERRITIN: Ferritin: 572 ng/mL — ABNORMAL HIGH (ref 24–336)

## 2019-04-30 LAB — D-DIMER, QUANTITATIVE: D-Dimer, Quant: 1.61 ug/mL-FEU — ABNORMAL HIGH (ref 0.00–0.50)

## 2019-04-30 LAB — HEPATITIS B SURFACE ANTIGEN: Hepatitis B Surface Ag: NONREACTIVE

## 2019-04-30 LAB — BRAIN NATRIURETIC PEPTIDE: B Natriuretic Peptide: 35.8 pg/mL (ref 0.0–100.0)

## 2019-04-30 LAB — ABO/RH: ABO/RH(D): O POS

## 2019-04-30 LAB — LACTATE DEHYDROGENASE: LDH: 519 U/L — ABNORMAL HIGH (ref 98–192)

## 2019-04-30 MED ORDER — SODIUM CHLORIDE 0.9 % IV SOLN
200.0000 mg | Freq: Once | INTRAVENOUS | Status: AC
  Administered 2019-04-30: 200 mg via INTRAVENOUS
  Filled 2019-04-30: qty 40

## 2019-04-30 MED ORDER — POTASSIUM CHLORIDE CRYS ER 20 MEQ PO TBCR
40.0000 meq | EXTENDED_RELEASE_TABLET | ORAL | Status: AC
  Administered 2019-04-30: 40 meq via ORAL
  Filled 2019-04-30: qty 2

## 2019-04-30 MED ORDER — ASCORBIC ACID 500 MG PO TABS
500.0000 mg | ORAL_TABLET | Freq: Every day | ORAL | Status: DC
  Administered 2019-04-30 – 2019-05-03 (×4): 500 mg via ORAL
  Filled 2019-04-30 (×4): qty 1

## 2019-04-30 MED ORDER — ALBUTEROL SULFATE HFA 108 (90 BASE) MCG/ACT IN AERS
2.0000 | INHALATION_SPRAY | Freq: Four times a day (QID) | RESPIRATORY_TRACT | Status: DC
  Administered 2019-05-01 – 2019-05-03 (×10): 2 via RESPIRATORY_TRACT
  Filled 2019-04-30 (×2): qty 6.7

## 2019-04-30 MED ORDER — ALBUTEROL SULFATE HFA 108 (90 BASE) MCG/ACT IN AERS: 2.0000 | INHALATION_SPRAY | Freq: Once | RESPIRATORY_TRACT | Status: DC

## 2019-04-30 MED ORDER — SODIUM CHLORIDE 0.9 % IV SOLN: INTRAVENOUS | Status: DC

## 2019-04-30 MED ORDER — DEXAMETHASONE SODIUM PHOSPHATE 10 MG/ML IJ SOLN
6.0000 mg | INTRAMUSCULAR | Status: DC
  Administered 2019-04-30 – 2019-05-01 (×2): 6 mg via INTRAVENOUS
  Filled 2019-04-30 (×2): qty 1

## 2019-04-30 MED ORDER — ACETAMINOPHEN 325 MG PO TABS
650.0000 mg | ORAL_TABLET | Freq: Four times a day (QID) | ORAL | Status: DC | PRN
  Administered 2019-05-02 (×2): 650 mg via ORAL
  Filled 2019-04-30: qty 2

## 2019-04-30 MED ORDER — AEROCHAMBER PLUS FLO-VU LARGE MISC
Status: AC
  Filled 2019-04-30: qty 1

## 2019-04-30 MED ORDER — HYDROCOD POLST-CPM POLST ER 10-8 MG/5ML PO SUER
5.0000 mL | Freq: Two times a day (BID) | ORAL | Status: DC | PRN
  Administered 2019-05-01: 5 mL via ORAL
  Filled 2019-04-30: qty 5

## 2019-04-30 MED ORDER — ONDANSETRON HCL 4 MG PO TABS: 4.0000 mg | ORAL_TABLET | Freq: Four times a day (QID) | ORAL | Status: DC | PRN

## 2019-04-30 MED ORDER — SODIUM CHLORIDE 0.9% FLUSH
3.0000 mL | Freq: Two times a day (BID) | INTRAVENOUS | Status: DC
  Administered 2019-05-01 – 2019-05-03 (×5): 3 mL via INTRAVENOUS

## 2019-04-30 MED ORDER — ENOXAPARIN SODIUM 40 MG/0.4ML ~~LOC~~ SOLN
40.0000 mg | SUBCUTANEOUS | Status: DC
  Administered 2019-04-30 – 2019-05-02 (×3): 40 mg via SUBCUTANEOUS
  Filled 2019-04-30 (×3): qty 0.4

## 2019-04-30 MED ORDER — SODIUM CHLORIDE 0.9 % IV BOLUS
250.0000 mL | Freq: Once | INTRAVENOUS | Status: AC
  Administered 2019-04-30: 250 mL via INTRAVENOUS

## 2019-04-30 MED ORDER — ONDANSETRON HCL 4 MG/2ML IJ SOLN: 4.0000 mg | Freq: Four times a day (QID) | INTRAMUSCULAR | Status: DC | PRN

## 2019-04-30 MED ORDER — GUAIFENESIN-DM 100-10 MG/5ML PO SYRP
10.0000 mL | ORAL_SOLUTION | ORAL | Status: DC | PRN
  Administered 2019-05-01 (×2): 10 mL via ORAL
  Filled 2019-04-30 (×2): qty 10

## 2019-04-30 MED ORDER — TERAZOSIN HCL 5 MG PO CAPS
10.0000 mg | ORAL_CAPSULE | Freq: Every day | ORAL | Status: DC
  Administered 2019-05-01 – 2019-05-02 (×3): 10 mg via ORAL
  Filled 2019-04-30 (×5): qty 2

## 2019-04-30 MED ORDER — HYDROCOD POLST-CPM POLST ER 10-8 MG/5ML PO SUER
5.0000 mL | Freq: Once | ORAL | Status: AC
  Administered 2019-04-30: 5 mL via ORAL
  Filled 2019-04-30: qty 5

## 2019-04-30 MED ORDER — ZINC SULFATE 220 (50 ZN) MG PO CAPS
220.0000 mg | ORAL_CAPSULE | Freq: Every day | ORAL | Status: DC
  Administered 2019-04-30 – 2019-05-03 (×4): 220 mg via ORAL
  Filled 2019-04-30 (×4): qty 1

## 2019-04-30 MED ORDER — PANTOPRAZOLE SODIUM 40 MG PO TBEC
40.0000 mg | DELAYED_RELEASE_TABLET | Freq: Two times a day (BID) | ORAL | Status: DC
  Administered 2019-04-30 – 2019-05-03 (×6): 40 mg via ORAL
  Filled 2019-04-30 (×6): qty 1

## 2019-04-30 MED ORDER — ENALAPRIL MALEATE 20 MG PO TABS
20.0000 mg | ORAL_TABLET | Freq: Once | ORAL | Status: DC
  Filled 2019-04-30: qty 1

## 2019-04-30 MED ORDER — SODIUM CHLORIDE 0.9 % IV SOLN
100.0000 mg | Freq: Every day | INTRAVENOUS | Status: DC
  Administered 2019-05-01: 100 mg via INTRAVENOUS
  Filled 2019-04-30: qty 20

## 2019-04-30 MED ORDER — ATENOLOL 100 MG PO TABS
100.0000 mg | ORAL_TABLET | Freq: Every day | ORAL | Status: DC
  Administered 2019-04-30 – 2019-05-03 (×4): 100 mg via ORAL
  Filled 2019-04-30 (×3): qty 1
  Filled 2019-04-30: qty 2

## 2019-04-30 MED ORDER — ALBUTEROL SULFATE HFA 108 (90 BASE) MCG/ACT IN AERS
6.0000 | INHALATION_SPRAY | Freq: Once | RESPIRATORY_TRACT | Status: AC
  Administered 2019-04-30: 6 via RESPIRATORY_TRACT
  Filled 2019-04-30: qty 6.7

## 2019-04-30 NOTE — ED Triage Notes (Signed)
Pt to triage by [facility].  EMS states pt diagnosed with COVID 7 days ago pt states symptoms have been going on for 2-3 weeks.  Reports increased SOB, body aches, and chills.

## 2019-04-30 NOTE — ED Provider Notes (Signed)
[facility] EMERGENCY DEPARTMENT Provider Note   CSN: [id number] Arrival date & time: 04/30/19  1009     History Chief Complaint  Patient presents with  . COVID +  . Shortness of Breath    Russell Prince is a 64 y.o. male.  HPI      Russell Prince is a 64 y.o. male, with a history of HTN and hyperlipidemia, presenting to the ED with cough and shortness of breath for the last 2 weeks. States he tested positive for Covid this past week.  Accompanied by chills, fatigue, and generalized body aches.  He has had generalized abdominal cramping with coughing. He has been drinking fluids, but has had poor appetite. Denies fever, focal abdominal pain, chest pain, N/V/D, urinary symptoms, or any other complaints.   Past Medical History:  Diagnosis Date  . Anemia   . Fatty liver   . GERD (gastroesophageal reflux disease)   . HLD (hyperlipidemia)   . HTN (hypertension)     Patient Active Problem List   Diagnosis Date Noted  . Pneumonia due to COVID-19 virus 04/30/2019    Past Surgical History:  Procedure Laterality Date  . Tooth Implants          Family History  Problem Relation Age of Onset  . Cancer Mother        unknown type  . Diabetes Sister   . Asthma Sister   . Liver disease Sister   . Colon cancer Maternal Uncle   . Stomach cancer Neg Hx   . Esophageal cancer Neg Hx     Social History   Tobacco Use  . Smoking status: Never Smoker  . Smokeless tobacco: Never Used  Substance Use Topics  . Alcohol use: Never  . Drug use: Never    Home Medications Prior to Admission medications   Medication Sig Start Date End Date Taking? Authorizing Provider  aspirin EC 81 MG tablet Take 81 mg by mouth daily.   Yes [provider]  atenolol (TENORMIN) 100 MG tablet Take 100 mg by mouth daily.   Yes [provider]  enalapril (VASOTEC) 20 MG tablet Take 20 mg by mouth daily.   Yes [provider]  pantoprazole (PROTONIX)  40 MG tablet Take 40 mg by mouth 2 (two) times daily before a meal.   Yes [provider]  sildenafil (VIAGRA) 50 MG tablet Take 25 mg by mouth daily as needed for erectile dysfunction.   Yes [provider]  terazosin (HYTRIN) 10 MG capsule Take 10 mg by mouth at bedtime.   Yes [provider]  HYDROcodone-acetaminophen (NORCO/VICODIN) 5-325 MG tablet Take 2 tablets by mouth every 4 (four) hours as needed. Patient not taking: Reported on 04/30/2019 05/08/18   Lacretia Leigh, MD  methocarbamol (ROBAXIN-750) 750 MG tablet Take 1 tablet (750 mg total) by mouth 4 (four) times daily. Patient not taking: Reported on 04/30/2019 05/08/18   Lacretia Leigh, MD    Allergies    Patient has no known allergies.  Review of Systems   Review of Systems  Constitutional: Positive for chills and fatigue.  Respiratory: Positive for cough and shortness of breath.   Cardiovascular: Negative for chest pain and leg swelling.  Gastrointestinal: Negative for abdominal pain, diarrhea, nausea and vomiting.  Genitourinary: Negative for dysuria.  Musculoskeletal: Positive for myalgias.  Neurological: Positive for weakness (generalized). Negative for syncope.  All other systems reviewed and are negative.   Physical Exam Updated Vital Signs BP (!) 163/124 (  BP Location: Right Arm)   Pulse (!) 122   Temp 98.8 F (37.1 C) (Oral)   Resp (!) 32   SpO2 94%   Physical Exam Vitals and nursing note reviewed.  Constitutional:      General: He is in acute distress.     Appearance: He is well-developed. He is ill-appearing. He is not diaphoretic.  HENT:     Head: Normocephalic and atraumatic.     Mouth/Throat:     Mouth: Mucous membranes are moist.     Pharynx: Oropharynx is clear.  Eyes:     Conjunctiva/sclera: Conjunctivae normal.  Cardiovascular:     Rate and Rhythm: Regular rhythm. Tachycardia present.     Pulses: Normal pulses.          Radial pulses are 2+ on the right side and 2+ on  the left side.       Posterior tibial pulses are 2+ on the right side and 2+ on the left side.     Heart sounds: Normal heart sounds.     Comments: Tactile temperature in the extremities appropriate and equal bilaterally. Pulmonary:     Effort: Tachypnea and respiratory distress present.     Breath sounds: Normal breath sounds.     Comments: Tachypneic with some increased work of breathing.  Conversational dyspnea. Abdominal:     Palpations: Abdomen is soft.     Tenderness: There is no abdominal tenderness. There is no guarding.  Musculoskeletal:     Cervical back: Neck supple.     Right lower leg: No edema.     Left lower leg: No edema.  Lymphadenopathy:     Cervical: No cervical adenopathy.  Skin:    General: Skin is warm and dry.  Neurological:     Mental Status: He is alert.  Psychiatric:        Mood and Affect: Mood and affect normal.        Speech: Speech normal.        Behavior: Behavior normal.     ED Results / Procedures / Treatments   Labs (all labs ordered are listed, but only abnormal results are displayed) Labs Reviewed  COMPREHENSIVE METABOLIC PANEL - Abnormal; Notable for the following components:      Result Value   Potassium 3.4 (*)    Glucose, Bld 105 (*)    Creatinine, Ser 1.48 (*)    Calcium 8.7 (*)    Albumin 3.1 (*)    AST 45 (*)    GFR calc non Af Amer 49 (*)    GFR calc Af Amer 57 (*)    All other components within normal limits  URINALYSIS, ROUTINE W REFLEX MICROSCOPIC - Abnormal; Notable for the following components:   Ketones, ur 20 (*)    Protein, ur 100 (*)    All other components within normal limits  CULTURE, BLOOD (ROUTINE X 2)  CULTURE, BLOOD (ROUTINE X 2)  RESPIRATORY PANEL BY RT PCR (FLU A&B, COVID)  LIPASE, BLOOD  CBC WITH DIFFERENTIAL/PLATELET  LACTIC ACID, PLASMA  LACTIC ACID, PLASMA  D-DIMER, QUANTITATIVE (NOT AT [facility])  PROCALCITONIN  LACTATE DEHYDROGENASE  FERRITIN  TRIGLYCERIDES  FIBRINOGEN  C-REACTIVE PROTEIN  HIV  ANTIBODY (ROUTINE TESTING W REFLEX)  HEPATITIS B SURFACE ANTIGEN  BRAIN NATRIURETIC PEPTIDE  POC SARS CORONAVIRUS 2 AG -  ED  ABO/RH  TYPE AND SCREEN  TROPONIN I (HIGH SENSITIVITY)  TROPONIN I (HIGH SENSITIVITY)    EKG EKG Interpretation  Date/Time:  Sunday April 30 2019 10:16:10 EST Ventricular Rate:  126 PR Interval:  148 QRS Duration: 70 QT Interval:  300 QTC Calculation: 434 R Axis:   7 Text Interpretation: Sinus tachycardia Nonspecific T wave abnormality Abnormal ECG No previous ECGs available Confirmed by Gareth Morgan [phone]) on 04/30/2019 12:12:52 PM   Radiology DG Chest Portable 1 View  Result Date: 04/30/2019 CLINICAL DATA:  Cough, dyspnea, COVID-19 positive EXAM: PORTABLE CHEST 1 VIEW COMPARISON:  None. FINDINGS: Low lung volumes. Normal heart size. Normal mediastinal contour. No pneumothorax. No pleural effusion. Mild hazy left lung base opacity. No pulmonary edema. IMPRESSION: Low lung volumes. Mild hazy left lung base opacity, which could represent pneumonia or atelectasis. Electronically Signed   By: Ilona Sorrel M.D.   On: 04/30/2019 12:15    Procedures Procedures (including critical care time)  Medications Ordered in ED Medications  atenolol (TENORMIN) tablet 100 mg (has no administration in time range)  terazosin (HYTRIN) capsule 10 mg (has no administration in time range)  pantoprazole (PROTONIX) EC tablet 40 mg (has no administration in time range)  enoxaparin (LOVENOX) injection 40 mg (has no administration in time range)  sodium chloride flush (NS) 0.9 % injection 3 mL (has no administration in time range)  albuterol (VENTOLIN HFA) 108 (90 Base) MCG/ACT inhaler 2 puff (has no administration in time range)  dexamethasone (DECADRON) injection 6 mg (has no administration in time range)  guaiFENesin-dextromethorphan (ROBITUSSIN DM) 100-10 MG/5ML syrup 10 mL (has no administration in time range)  chlorpheniramine-HYDROcodone (TUSSIONEX) 10-8 MG/5ML  suspension 5 mL (has no administration in time range)  ascorbic acid (VITAMIN C) tablet 500 mg (has no administration in time range)  zinc sulfate capsule 220 mg (has no administration in time range)  acetaminophen (TYLENOL) tablet 650 mg (has no administration in time range)  ondansetron (ZOFRAN) tablet 4 mg (has no administration in time range)    Or  ondansetron (ZOFRAN) injection 4 mg (has no administration in time range)  potassium chloride SA (KLOR-CON) CR tablet 40 mEq (has no administration in time range)  0.9 %  sodium chloride infusion (has no administration in time range)  sodium chloride 0.9 % bolus 250 mL (0 mLs Intravenous Stopped 04/30/19 1256)  albuterol (VENTOLIN HFA) 108 (90 Base) MCG/ACT inhaler 6 puff (6 puffs Inhalation Given 04/30/19 1218)  chlorpheniramine-HYDROcodone (TUSSIONEX) 10-8 MG/5ML suspension 5 mL (5 mLs Oral Given 04/30/19 1319)  AeroChamber Plus Flo-Vu Large MISC (  Given 04/30/19 1550)    ED Course  I have reviewed the triage vital signs and the nursing notes.  Pertinent labs & imaging results that were available during my care of the patient were reviewed by me and considered in my medical decision making (see chart for details).  Clinical Course as of Apr 29 1600  Sun Apr 30, 2019  1230 Spoke with patient's wife.  Diagnosed with COVID at [facility] January 6. Symptoms started Dec 29. Started with cough and shortness of breath. "His color just looked bad. He looks gray more and more." Wife endorses complaints by the patient of chest pain, headache, vomiting.  Patient has also intermittently had fever with T-max 103 F. Has been drinking plenty of fluids, but has not been eating that last couple days.  EMS was call two days ago, but decided to decline transport due to concerns over wait times in the ED. She also adds that a spot was noted in one of his lungs by the [facility] March 2020. She does not remember any further details.   [  SJ]  1419 Spoke with Dr.  Tamala Julian, hospitalist. Agrees to admit the patient.   [SJ]    Clinical Course User Index [SJ] Benjermin Korber, Helane Gunther, PA-C   MDM Rules/Calculators/A&P                         Patient presents with shortness of breath and cough.  Patient is nontoxic appearing, however, is ill-appearing.  Tachycardic, tachypneic, and conversationally dyspneic. I suspect the symptoms to be related to COVID-19 infection.  SPO2 as low as 89% on room air. An AKI is possible, though I do not have a previous creatinine or BUN with which to compare the patient's current value. Due to the patient's presentation, I do suspect patient would benefit from admission. Covid specific labs ordered, but results pending at time of admission.   Findings and plan of care discussed with Gareth Morgan, MD.   Russell Prince was evaluated in Emergency Department on 04/30/2019 for the symptoms described in the history of present illness. He was evaluated in the context of the global COVID-19 pandemic, which necessitated consideration that the patient might be at risk for infection with the SARS-CoV-2 virus that causes COVID-19. Institutional protocols and algorithms that pertain to the evaluation of patients at risk for COVID-19 are in a state of rapid change based on information released by regulatory bodies including the CDC and federal and state organizations. These policies and algorithms were followed during the patient's care in the ED.    Final Clinical Impression(s) / ED Diagnoses Final diagnoses:  COVID-19  Shortness of breath    Rx / DC Orders ED Discharge Orders    None       Layla Maw 04/30/19 1603    Gareth Morgan, MD 05/03/19 1523

## 2019-04-30 NOTE — H&P (Signed)
History and Physical    Russell Prince [id number] DOB: 01-02-1956 DOA: 04/30/2019  Referring MD/NP/PA: Santiago Bur, PA-C PCP: Henreitta Cea, MD  Patient coming from: Home  Chief Complaint: Cough and shortness of breath.  I have personally briefly reviewed patient's old medical records in [place]   HPI: Russell Prince is a 64 y.o. male with medical history significant of hypertension, hyperlipidemia, GERD, and anemia.  Patient presents with complaints of progressively worsening cough and shortness of breath over the last 2 weeks.  He goes into these coughing spells where he is unable to catch his breath.  He was diagnosed at the [facility] hospital earlier in the week with COVID-19 on 1/6. Associated symptoms of chills, diaphoresis, generalized body aches, abdominal pain/cramps secondary to coughing, and decreased intake of food.  He has been trying to drink plenty of fluids.  Denies having nausea, vomiting, or diarrhea symptoms.   ED Course: On admission into the emergency department patient was noted to be afebrile, pulse 103-1 22, respiration 24-34, blood pressures elevated up to 196/106, and O2 saturations noted to be as low as 89% on room air with improvement to 98% on 2 L.  COVID-19 screening was positive.  Labs significant for potassium 3.4, BUN 9, creatinine 1.48, and AST 45.  Chest x-ray noted low lung volumes with mild left lung base opacity concerning for pneumonia versus atelectasis.  Review of Systems  Constitutional: Positive for chills, diaphoresis and malaise/fatigue.  HENT: Negative for ear discharge and sinus pain.   Eyes: Negative for photophobia and pain.  Respiratory: Positive for shortness of breath.   Cardiovascular: Negative for chest pain and leg swelling.  Gastrointestinal: Positive for abdominal pain. Negative for diarrhea, nausea and vomiting.  Genitourinary: Negative for dysuria and hematuria.  Musculoskeletal: Positive for myalgias. Negative for falls.   Skin: Negative for rash.  Neurological: Positive for weakness. Negative for loss of consciousness.  Psychiatric/Behavioral: Negative for memory loss and substance abuse.    Past Medical History:  Diagnosis Date  . Anemia   . Fatty liver   . GERD (gastroesophageal reflux disease)   . HLD (hyperlipidemia)   . HTN (hypertension)     Past Surgical History:  Procedure Laterality Date  . Tooth Implants        reports that he has never smoked. He has never used smokeless tobacco. He reports that he does not drink alcohol or use drugs.  No Known Allergies  Family History  Problem Relation Age of Onset  . Cancer Mother        unknown type  . Diabetes Sister   . Asthma Sister   . Liver disease Sister   . Colon cancer Maternal Uncle   . Stomach cancer Neg Hx   . Esophageal cancer Neg Hx     Prior to Admission medications   Medication Sig Start Date End Date Taking? Authorizing Provider  aspirin EC 81 MG tablet Take 81 mg by mouth daily.   Yes [provider]  atenolol (TENORMIN) 100 MG tablet Take 100 mg by mouth daily.   Yes [provider]  enalapril (VASOTEC) 20 MG tablet Take 20 mg by mouth daily.   Yes [provider]  pantoprazole (PROTONIX) 40 MG tablet Take 40 mg by mouth 2 (two) times daily before a meal.   Yes [provider]  sildenafil (VIAGRA) 50 MG tablet Take 25 mg by mouth daily as needed for erectile dysfunction.   Yes [provider]  terazosin (HYTRIN)  10 MG capsule Take 10 mg by mouth at bedtime.   Yes [provider]  HYDROcodone-acetaminophen (NORCO/VICODIN) 5-325 MG tablet Take 2 tablets by mouth every 4 (four) hours as needed. Patient not taking: Reported on 04/30/2019 05/08/18   Lacretia Leigh, MD  methocarbamol (ROBAXIN-750) 750 MG tablet Take 1 tablet (750 mg total) by mouth 4 (four) times daily. Patient not taking: Reported on 04/30/2019 05/08/18   Lacretia Leigh, MD    Physical  Exam:  Constitutional: Older male who appears to be uncomfortable coughing Vitals:   04/30/19 1130 04/30/19 1145 04/30/19 1200 04/30/19 1300  BP: (!) 162/129 (!) 161/124 (!) 162/127 (!) 160/122  Pulse: (!) 107 (!) 106 (!) 105 (!) 103  Resp: (!) 24 (!) 25 (!) 28 (!) 25  Temp:      TempSrc:      SpO2: 96% 97% 98% 96%   Eyes: PERRL, lids and conjunctivae normal ENMT: Mucous membranes are dry. Posterior pharynx clear of any exudate or lesions.  Neck: normal, supple, no masses, no thyromegaly Respiratory:   Tachypneic with decreased aeration and crackles heard at the left lung base.  No significant wheezing.  Patient currently on 2 L of oxygen with O2 saturations currently maintained. Cardiovascular: Tachycardia, no murmurs / rubs / gallops. No extremity edema. 2+ pedal pulses. No carotid bruits.  Abdomen: Mild tenderness musculature of the abdomen, no masses palpated. No hepatosplenomegaly. Bowel sounds positive.  Musculoskeletal: no clubbing / cyanosis. No joint deformity upper and lower extremities. Good ROM, no contractures. Normal muscle tone.  Skin: Diaphoretic. Neurologic: CN 2-12 grossly intact. Sensation intact, DTR normal. Strength 5/5 in all 4.  Psychiatric: Normal judgment and insight. Alert and oriented x 3. Normal mood.     Labs on Admission: I have personally reviewed following labs and imaging studies  CBC: Recent Labs  Lab 04/30/19 1141  WBC 4.1  NEUTROABS 2.8  HGB 15.4  HCT 48.2  MCV 88.4  PLT 123XX123   Basic Metabolic Panel: Recent Labs  Lab 04/30/19 1141  NA 138  K 3.4*  CL 99  CO2 29  GLUCOSE 105*  BUN 9  CREATININE 1.48*  CALCIUM 8.7*   GFR: CrCl cannot be calculated (Unknown ideal weight.). Liver Function Tests: Recent Labs  Lab 04/30/19 1141  AST 45*  ALT 22  ALKPHOS 66  BILITOT 0.7  PROT 7.6  ALBUMIN 3.1*   Recent Labs  Lab 04/30/19 1141  LIPASE 25   No results for input(s): AMMONIA in the last 168 hours. Coagulation Profile: No  results for input(s): INR, PROTIME in the last 168 hours. Cardiac Enzymes: No results for input(s): CKTOTAL, CKMB, CKMBINDEX, TROPONINI in the last 168 hours. BNP (last 3 results) No results for input(s): PROBNP in the last 8760 hours. HbA1C: No results for input(s): HGBA1C in the last 72 hours. CBG: No results for input(s): GLUCAP in the last 168 hours. Lipid Profile: No results for input(s): CHOL, HDL, LDLCALC, TRIG, CHOLHDL, LDLDIRECT in the last 72 hours. Thyroid Function Tests: No results for input(s): TSH, T4TOTAL, FREET4, T3FREE, THYROIDAB in the last 72 hours. Anemia Panel: No results for input(s): VITAMINB12, FOLATE, FERRITIN, TIBC, IRON, RETICCTPCT in the last 72 hours. Urine analysis:    Component Value Date/Time   COLORURINE YELLOW 04/30/2019 1325   APPEARANCEUR CLEAR 04/30/2019 1325   LABSPEC 1.019 04/30/2019 1325   PHURINE 6.0 04/30/2019 1325   GLUCOSEU NEGATIVE 04/30/2019 1325   HGBUR NEGATIVE 04/30/2019 [place] 04/30/2019 1325   KETONESUR  20 (A) 04/30/2019 1325   PROTEINUR 100 (A) 04/30/2019 1325   NITRITE NEGATIVE 04/30/2019 1325   LEUKOCYTESUR NEGATIVE 04/30/2019 1325   Sepsis Labs: No results found for this or any previous visit (from the past 240 hour(s)).   Radiological Exams on Admission: DG Chest Portable 1 View  Result Date: 04/30/2019 CLINICAL DATA:  Cough, dyspnea, COVID-19 positive EXAM: PORTABLE CHEST 1 VIEW COMPARISON:  None. FINDINGS: Low lung volumes. Normal heart size. Normal mediastinal contour. No pneumothorax. No pleural effusion. Mild hazy left lung base opacity. No pulmonary edema. IMPRESSION: Low lung volumes. Mild hazy left lung base opacity, which could represent pneumonia or atelectasis. Electronically Signed   By: Ilona Sorrel M.D.   On: 04/30/2019 12:15    EKG: Independently reviewed.  Sinus tachycardia 124 bpm.  Assessment/Plan Acute respiratory failure with hypoxia secondary to pneumonia due to COVID-19:  Patient presents with signs of progressively worsening shortness of breath and cough.  Diagnosed with COVID-19 earlier this week.  O2 saturations as low as 89% requiring 2 L of nasal cannula oxygen to maintain saturation greater than 90%. Chest x-ray showing possible signs of left lower lobe pneumonia.  -Admit to a medical telemetry bed -COVID-19 admission order set utilized -Continuous pulse oximetry with nasal cannula oxygen to maintain O2 saturation greater than 90% -Follow-up blood culture -Albuterol inhaler -Antitussives as needed -Vitamin C and zinc -Remdesivir per pharmacy -Check inflammatory markers stat and continue to monitor daily  Suspected acute kidney injury: Creatinine 1.48 and BUN 9 on admission, but no previous kidney function labs to compare.  Patient denies any previous history of being told that he had kidney problems -Normal saline IV fluids 75 mL/h -Recheck kidney function in  Hypertensive urgency: Acute.  Patient's blood pressures noted to be elevated up to 186/106 on admission.  Suspect at least in part associated with his acute illness. -Continue atenolol -Hold vasotec due to suspected AKI   Hypokalemia: Acute.  Initial potassium mildly low at 3.4. -Give 40 mEq of potassium chloride x1 dose now -Continue to monitor and replace as needed   Elevated AST: AST mildly elevated at 45.  -Recheck CMP in a.m.  BPH -Continue Terazosin  DVT prophylaxis: Lovenox Code Status: Full Family Communication: Discussed plan of care with the patient's wife over the phone Disposition Plan: Possible discharge home when medically stable Consults called: None Admission status: Inpatient  Norval Morton MD [facility] Pager [phone]   If 7PM-7AM, please contact night-coverage [email or web address] Password [facility]  04/30/2019, 3:51 PM

## 2019-04-30 NOTE — ED Notes (Signed)
RN assisted this pt with urinal, pt began coughing, unable to catch his breath, sats ranged between 93-94% on 2L [place], HR increased to 142, RR 44. EDP and EDPA made aware

## 2019-04-30 NOTE — ED Notes (Signed)
First lactec acid normal

## 2019-05-01 DIAGNOSIS — J1282 Pneumonia due to coronavirus disease 2019: Secondary | ICD-10-CM

## 2019-05-01 DIAGNOSIS — N179 Acute kidney failure, unspecified: Secondary | ICD-10-CM

## 2019-05-01 DIAGNOSIS — J9601 Acute respiratory failure with hypoxia: Secondary | ICD-10-CM

## 2019-05-01 DIAGNOSIS — N400 Enlarged prostate without lower urinary tract symptoms: Secondary | ICD-10-CM

## 2019-05-01 DIAGNOSIS — U071 COVID-19: Principal | ICD-10-CM

## 2019-05-01 DIAGNOSIS — I160 Hypertensive urgency: Secondary | ICD-10-CM

## 2019-05-01 LAB — TYPE AND SCREEN
ABO/RH(D): O POS
Antibody Screen: NEGATIVE

## 2019-05-01 LAB — ABO/RH: ABO/RH(D): O POS

## 2019-05-01 MED ORDER — SODIUM CHLORIDE 0.9% IV SOLUTION: Freq: Once | INTRAVENOUS | Status: AC

## 2019-05-01 MED ORDER — DEXAMETHASONE SODIUM PHOSPHATE 10 MG/ML IJ SOLN
6.0000 mg | Freq: Two times a day (BID) | INTRAMUSCULAR | Status: DC
  Administered 2019-05-01 – 2019-05-02 (×3): 6 mg via INTRAVENOUS
  Filled 2019-05-01 (×3): qty 1

## 2019-05-01 MED ORDER — ENSURE ENLIVE PO LIQD
237.0000 mL | Freq: Three times a day (TID) | ORAL | Status: DC
  Administered 2019-05-01 – 2019-05-03 (×6): 237 mL via ORAL

## 2019-05-01 MED ORDER — ORAL CARE MOUTH RINSE
15.0000 mL | Freq: Two times a day (BID) | OROMUCOSAL | Status: DC
  Administered 2019-05-01 – 2019-05-03 (×5): 15 mL via OROMUCOSAL

## 2019-05-01 NOTE — Progress Notes (Signed)
PROGRESS NOTE  Russell Prince  VXB:[id number] DOB: January 17, 1956 DOA: 04/30/2019 PCP: Henreitta Cea, MD   Brief Narrative: Russell Prince is a 65 y.o. male with a history of HTN, HLD, GERD, and anemia who presented with coughing and worsening dyspnea for the previous 2 weeks, having been diagnosed with covid-19 at the [place] 1/6. In the ED on 1/10 he was tachycardic, tachypneic, hypertensive and hypoxic requiring 2L O2. SARS-CoV-2 confirmed to be positive. Chest x-ray noted low lung volumes with mild left lung base opacity concerning for pneumonia versus atelectasis. Remdesivir and steroids were given, patient admitted to [facility].   Assessment & Plan: Principal Problem:   Pneumonia due to COVID-19 virus Active Problems:   Acute respiratory failure with hypoxia (HCC)   BPH (benign prostatic hyperplasia)   AKI (acute kidney injury) ([place])   Hypertensive urgency  Acute hypoxemic respiratory failure due to covid-19 pneumonia: SARS-CoV-2 PCR positive reportedly 1/6, confirmed 1/10 with negative Ag at that time.  - Continue remdesivir x5 days (1/10 - 1/14) - Steroids x10 days, augment dose today. With elevated CRP but only mild hypoxia, will recommend CCP in lieu of tocilizumab, though this off label medication was discussed at length with inclusion criteria being met and exclusion criteria not present, the patient does consent to administration in the event of advancing hypoxia. The patient and his wife both consent to CCP today.  - Vitamin C, zinc - Encourage OOB, IS, FV, and awake proning if able - Tylenol and antitussives prn - Continue airborne, contact precautions while admitted. Isolation period would be recommended for 21 days from positive testing. - Check CBC w/diff, CMP, CRP daily - Enoxaparin prophylactic dose.  - Maintain euvolemia/net negative.  - Avoid NSAIDs   Suspected acute kidney injury: Creatinine 1.48 and BUN 9 on admission, but no previous kidney function labs to compare.  Patient  denies any previous history of being told that he had kidney problems - Push po fluids, can stop IVF.  - Monitor renal function tomorrow  Hypertensive urgency: Acute, improving.  Patient's blood pressures noted to be elevated up to 186/106 on admission.  Suspect at least in part associated with his acute illness. -Continue atenolol -Hold vasotec due to suspected AKI   Hypokalemia: Mild - Continue monitoring in AM   Elevated AST: AST mildly elevated at 45.  - Recheck CMP tomorrow   BPH -Continue terazosin  Constipation:  - Start bowel regimen  DVT prophylaxis: Lovenox Code Status: Full Family Communication: Wife by phone at length Disposition Plan: Uncertain   Consultants:   None  Procedures:   CCP  Antimicrobials:  Remdesivir   Subjective: Shortness of breath at rest is stable, mild, improved with oxygen, worse with exertion to the point of being severe and limiting ambulation. No chest pain unless coughing. Gets coughing spells that are persistent and severe.  Objective: Vitals:   05/01/19 0751 05/01/19 0906 05/01/19 1103 05/01/19 1623  BP: (!) 137/102  128/86 (!) 142/101  Pulse: 74   70  Resp: _0 (!) 22  Temp: 97.7 F (36.5 C) (!) 97.4 F (36.3 C) (!) 97.2 F (36.2 C) 97.6 F (36.4 C)  TempSrc: Oral  Axillary Oral  SpO2: 94%   94%  Weight:      Height:        Intake/Output Summary (Last 24 hours) at 05/01/2019 1659 Last data filed at 05/01/2019 0926 Gross per 24 hour  Intake 1083.83 ml  Output 200 ml  Net 883.83 ml   Danley Danker  Weights   05/01/19 0400  Weight: 88.3 kg    Gen: 64 y.o. male in no distress Pulm: Non-labored w/supplemental oxygen at rest. Crackles at L base  CV: Regular rate and rhythm. No murmur, rub, or gallop. No JVD, no pedal edema. GI: Abdomen soft, non-tender, non-distended, with normoactive bowel sounds. No organomegaly or masses felt. Ext: Warm, no deformities Skin: No rashes, lesions no ulcers Neuro: Alert and  oriented, foggy sensorium. No focal neurological deficits. Psych: Judgement and insight appear normal. Mood & affect appropriate.   Data Reviewed: I have personally reviewed following labs and imaging studies  CBC: Recent Labs  Lab 04/30/19 1141  WBC 4.1  NEUTROABS 2.8  HGB 15.4  HCT 48.2  MCV 88.4  PLT 151   Basic Metabolic Panel: Recent Labs  Lab 04/30/19 1141  NA 138  K 3.4*  CL 99  CO2 29  GLUCOSE 105*  BUN 9  CREATININE 1.48*  CALCIUM 8.7*   GFR: Estimated Creatinine Clearance: 55 mL/min (A) (by C-G formula based on SCr of 1.48 mg/dL (H)). Liver Function Tests: Recent Labs  Lab 04/30/19 1141  AST 45*  ALT 22  ALKPHOS 66  BILITOT 0.7  PROT 7.6  ALBUMIN 3.1*   Recent Labs  Lab 04/30/19 1141  LIPASE 25   Anemia Panel: Recent Labs    04/30/19 1230  FERRITIN 572*   Urine analysis:    Component Value Date/Time   COLORURINE YELLOW 04/30/2019 1325   APPEARANCEUR CLEAR 04/30/2019 1325   LABSPEC 1.019 04/30/2019 1325   PHURINE 6.0 04/30/2019 1325   GLUCOSEU NEGATIVE 04/30/2019 1325   HGBUR NEGATIVE 04/30/2019 1325   [place] 04/30/2019 1325   KETONESUR 20 (A) 04/30/2019 1325   PROTEINUR 100 (A) 04/30/2019 1325   NITRITE NEGATIVE 04/30/2019 1325   LEUKOCYTESUR NEGATIVE 04/30/2019 1325   Recent Results (from the past 240 hour(s))  Blood Culture (routine x 2)     Status: None (Preliminary result)   Collection Time: 04/30/19  1:15 PM   Specimen: BLOOD  Result Value Ref Range Status   Specimen Description BLOOD LEFT ANTECUBITAL  Final   Special Requests   Final    BOTTLES DRAWN AEROBIC AND ANAEROBIC Blood Culture adequate volume   Culture   Final    NO GROWTH < 24 HOURS Performed at [facility], [place] [address], [place], [place] [address]    Report Status PENDING  Incomplete  Blood Culture (routine x 2)     Status: None (Preliminary result)   Collection Time: 04/30/19  3:50 PM   Specimen: BLOOD  Result Value Ref Range Status    Specimen Description BLOOD RIGHT ANTECUBITAL  Final   Special Requests   Final    BOTTLES DRAWN AEROBIC AND ANAEROBIC Blood Culture adequate volume   Culture   Final    NO GROWTH < 24 HOURS Performed at [facility], [place] [address], [place], [place] [address]    Report Status PENDING  Incomplete  Respiratory Panel by RT PCR (Flu A&B, Covid) - Nasopharyngeal Swab     Status: Abnormal   Collection Time: 04/30/19  3:58 PM   Specimen: Nasopharyngeal Swab  Result Value Ref Range Status   SARS Coronavirus 2 by RT PCR POSITIVE (A) NEGATIVE Final    Comment: RESULT CALLED TO, READ BACK BY AND VERIFIED WITH: Benard Halsted, ED RN AT 1914 ON 04/30/19 BY C. JESSUP, MT. (NOTE) SARS-CoV-2 target nucleic acids are DETECTED. SARS-CoV-2 RNA is generally detectable in  upper respiratory specimens  during the acute phase of infection. Positive results are indicative of the presence of the identified virus, but do not rule out bacterial infection or co-infection with other pathogens not detected by the test. Clinical correlation with patient history and other diagnostic information is necessary to determine patient infection status. The expected result is Negative. Fact Sheet for Patients:  PinkCheek.be Fact Sheet for Healthcare Providers: GravelBags.it This test is not yet approved or cleared by the [place] FDA and  has been authorized for detection and/or diagnosis of SARS-CoV-2 by FDA under an Emergency Use Authorization (EUA).  This EUA will remain in effect (meaning this te st can be used) for the duration of  the COVID-19 declaration under Section 564(b)(1) of the Act, 21 U.S.C. section 360bbb-3(b)(1), unless the authorization is terminated or revoked sooner.    Influenza A by PCR NEGATIVE NEGATIVE Final   Influenza B by PCR NEGATIVE NEGATIVE Final    Comment: (NOTE) The Xpert Xpress SARS-CoV-2/FLU/RSV assay is intended as an  aid in  the diagnosis of influenza from Nasopharyngeal swab specimens and  should not be used as a sole basis for treatment. Nasal washings and  aspirates are unacceptable for Xpert Xpress SARS-CoV-2/FLU/RSV  testing. Fact Sheet for Patients: PinkCheek.be Fact Sheet for Healthcare Providers: GravelBags.it This test is not yet approved or cleared by the [place] FDA and  has been authorized for detection and/or diagnosis of SARS-CoV-2 by  FDA under an Emergency Use Authorization (EUA). This EUA will remain  in effect (meaning this test can be used) for the duration of the  Covid-19 declaration under Section 564(b)(1) of the Act, 21  U.S.C. section 360bbb-3(b)(1), unless the authorization is  terminated or revoked. Performed at [facility], [place] [address], [place], [place] [address]       Radiology Studies: DG Chest Portable 1 View  Result Date: 04/30/2019 CLINICAL DATA:  Cough, dyspnea, COVID-19 positive EXAM: PORTABLE CHEST 1 VIEW COMPARISON:  None. FINDINGS: Low lung volumes. Normal heart size. Normal mediastinal contour. No pneumothorax. No pleural effusion. Mild hazy left lung base opacity. No pulmonary edema. IMPRESSION: Low lung volumes. Mild hazy left lung base opacity, which could represent pneumonia or atelectasis. Electronically Signed   By: Ilona Sorrel M.D.   On: 04/30/2019 12:15    Scheduled Meds: . albuterol  2 puff Inhalation Q6H  . vitamin C  500 mg Oral Daily  . atenolol  100 mg Oral Daily  . dexamethasone (DECADRON) injection  6 mg Intravenous Q24H  . enoxaparin (LOVENOX) injection  40 mg Subcutaneous Q24H  . feeding supplement (ENSURE ENLIVE)  237 mL Oral TID BM  . mouth rinse  15 mL Mouth Rinse BID  . pantoprazole  40 mg Oral BID AC  . sodium chloride flush  3 mL Intravenous Q12H  . terazosin  10 mg Oral QHS  . zinc sulfate  220 mg Oral Daily   Continuous Infusions: . sodium chloride 75  mL/hr at 05/01/19 1101  . remdesivir 100 mg in NS 100 mL 100 mg (05/01/19 1641)     LOS: 1 day   Time spent: 35 minutes.  Patrecia Pour, MD [facility] [email or web address] 05/01/2019, 4:59 PM

## 2019-05-01 NOTE — Progress Notes (Signed)
Initial Nutrition Assessment  DOCUMENTATION CODES:   Not applicable  INTERVENTION:    Ensure Enlive po BID, each supplement provides 350 kcal and 20 grams of protein  Pt receiving Hormel Shake daily with Breakfast which provides 520 kcals and 22 g of protein and Magic cup BID with lunch and dinner, each supplement provides 290 kcal and 9 grams of protein, automatically on meal trays to optimize nutritional intake.    NUTRITION DIAGNOSIS:   Increased nutrient needs related to acute illness(COVID-19) as evidenced by estimated needs.  GOAL:   Patient will meet greater than or equal to 90% of their needs  MONITOR:   PO intake, Supplement acceptance  REASON FOR ASSESSMENT:   Malnutrition Screening Tool    ASSESSMENT:   64 yo male admitted with progressively worsening cough and SOB x 2 weeks. COVID 19 positive on 1/6. PMH includes HTN, HLD, GERD, anemia.    Labs reviewed. K 3.4 (L) Medications reviewed and include decadron, vitamin C, zinc sulfate.  Patient consumed 35% of breakfast today.   Over the past year, patient has lost 9.5% of usual weight, not significant for the time frame. No weights available within the past year for comparison.   Patient would benefit from PO supplements to ensure adequate intake to meet increased nutrition needs for COVID-19.   NUTRITION - FOCUSED PHYSICAL EXAM:  unable to complete due to COVID restrictions  Diet Order:   Diet Order            Diet Heart Room service appropriate? Yes; Fluid consistency: Thin  Diet effective now              EDUCATION NEEDS:   Not appropriate for education at this time  Skin:  Skin Assessment: Reviewed RN Assessment  Last BM:  1/9  Height:   Ht Readings from Last 1 Encounters:  05/01/19 5' 8.5" (1.74 m)    Weight:   Wt Readings from Last 1 Encounters:  05/01/19 88.3 kg    Ideal Body Weight:  71.4 kg  BMI:  Body mass index is 29.18 kg/m.  Estimated Nutritional Needs:   Kcal:   2200-2500  Protein:  115-135 gm  Fluid:  >/= 2.2 L    Molli Barrows, RD, LDN, CNSC Pager [phone] After Hours Pager [phone]

## 2019-05-01 NOTE — ED Notes (Signed)
[facility] Called 

## 2019-05-01 NOTE — Progress Notes (Addendum)
Consent for CCP obtained from patient and placed in chart.

## 2019-05-01 NOTE — Progress Notes (Signed)
Patients wife (Angeleta Slee) updated.

## 2019-05-01 NOTE — Plan of Care (Signed)
Pt is progressing 

## 2019-05-02 LAB — COMPREHENSIVE METABOLIC PANEL
ALT: 21 U/L (ref 0–44)
AST: 30 U/L (ref 15–41)
Albumin: 3.2 g/dL — ABNORMAL LOW (ref 3.5–5.0)
Alkaline Phosphatase: 59 U/L (ref 38–126)
Anion gap: 12 (ref 5–15)
BUN: 24 mg/dL — ABNORMAL HIGH (ref 8–23)
CO2: 23 mmol/L (ref 22–32)
Calcium: 9 mg/dL (ref 8.9–10.3)
Chloride: 105 mmol/L (ref 98–111)
Creatinine, Ser: 1.18 mg/dL (ref 0.61–1.24)
GFR calc Af Amer: >60 mL/min (ref >60)
GFR calc non Af Amer: >60 mL/min (ref >60)
Glucose, Bld: 124 mg/dL — ABNORMAL HIGH (ref 70–99)
Potassium: 4.3 mmol/L (ref 3.5–5.1)
Sodium: 140 mmol/L (ref 135–145)
Total Bilirubin: 0.6 mg/dL (ref 0.3–1.2)
Total Protein: 7.3 g/dL (ref 6.5–8.1)

## 2019-05-02 LAB — D-DIMER, QUANTITATIVE: D-Dimer, Quant: 0.66 ug/mL-FEU — ABNORMAL HIGH (ref 0.00–0.50)

## 2019-05-02 LAB — PREPARE FRESH FROZEN PLASMA: Unit division: 0

## 2019-05-02 LAB — C-REACTIVE PROTEIN: CRP: 6.4 mg/dL — ABNORMAL HIGH (ref <1.0)

## 2019-05-02 LAB — MAGNESIUM: Magnesium: 2.2 mg/dL (ref 1.7–2.4)

## 2019-05-02 LAB — BPAM FFP
Blood Product Expiration Date: 202101122210
ISSUE DATE / TIME: 202101112211
Unit Type and Rh: 5100

## 2019-05-02 LAB — FERRITIN: Ferritin: 387 ng/mL — ABNORMAL HIGH (ref 24–336)

## 2019-05-02 MED ORDER — SODIUM CHLORIDE 0.9 % IV SOLN
100.0000 mg | INTRAVENOUS | Status: DC
  Administered 2019-05-02: 100 mg via INTRAVENOUS
  Filled 2019-05-02: qty 20

## 2019-05-02 MED ORDER — AMLODIPINE BESYLATE 10 MG PO TABS
10.0000 mg | ORAL_TABLET | Freq: Every day | ORAL | Status: DC
  Administered 2019-05-02 – 2019-05-03 (×2): 10 mg via ORAL
  Filled 2019-05-02: qty 1

## 2019-05-02 MED ORDER — HYDRALAZINE HCL 25 MG PO TABS
25.0000 mg | ORAL_TABLET | Freq: Three times a day (TID) | ORAL | Status: DC
  Administered 2019-05-02 – 2019-05-03 (×4): 25 mg via ORAL
  Filled 2019-05-02 (×4): qty 1

## 2019-05-02 MED ORDER — DEXAMETHASONE 6 MG PO TABS
6.0000 mg | ORAL_TABLET | Freq: Two times a day (BID) | ORAL | Status: DC
  Administered 2019-05-03: 6 mg via ORAL
  Filled 2019-05-02: qty 1

## 2019-05-02 MED ORDER — SODIUM CHLORIDE 0.9 % IV SOLN
100.0000 mg | INTRAVENOUS | Status: DC
  Administered 2019-05-03: 100 mg via INTRAVENOUS
  Filled 2019-05-02: qty 20

## 2019-05-02 NOTE — Progress Notes (Signed)
PROGRESS NOTE  Tayshaun Parziale  [id number] DOB: May 13, 1955 DOA: 04/30/2019 PCP: Henreitta Cea, MD   Brief Narrative: Naadir Hokama is a 64 y.o. male with a history of HTN, HLD, GERD, and anemia who presented with coughing and worsening dyspnea for the previous 2 weeks, having been diagnosed with covid-19 at the [place] 1/6. In the ED on 1/10 he was tachycardic, tachypneic, hypertensive and hypoxic requiring 2L O2. SARS-CoV-2 confirmed to be positive. Chest x-ray noted low lung volumes with mild left lung base opacity concerning for pneumonia versus atelectasis. Remdesivir and steroids were given, patient admitted to [facility].   Assessment & Plan: Principal Problem:   Pneumonia due to COVID-19 virus Active Problems:   Acute respiratory failure with hypoxia (HCC)   BPH (benign prostatic hyperplasia)   AKI (acute kidney injury) ([place])   Hypertensive urgency  Acute hypoxemic respiratory failure due to covid-19 pneumonia: SARS-CoV-2 PCR positive reportedly 1/6, confirmed 1/10 with negative Ag at that time.  - Continue remdesivir x5 days (1/10 - 1/14), will accelerate dose today and, if hypoxia resolves and patient's mobility improves, can DC 1/13 with plans to complete course in infusion clinic after discharge. - Steroids x10 days, continue augmented dose. With elevated CRP but only mild hypoxia, CCP ordered 1/11 in lieu of tocilizumab. CRP and hypoxia are responding well.   - Vitamin C, zinc - Encourage OOB, IS, FV, and awake proning if able - Tylenol and antitussives prn - Continue airborne, contact precautions while admitted. Isolation period would be recommended for 21 days from positive testing. - Recheck CMP, CRP in AM. D-dimer showing significant improvement.  - Enoxaparin prophylactic dose.  - Maintain euvolemia/net negative.  - Avoid NSAIDs   Suspected acute kidney injury: Creatinine 1.48 and BUN 9 on admission, but no previous kidney function labs to compare.  Patient denies any previous  history of being told that he had kidney problems - Push po fluids - Check metabolic panel as above. - Avoid nephrotoxins.  Hypertensive urgency: Acute, improving.  Patient's blood pressures noted to be elevated up to 186/106 on admission.  Suspect at least in part associated with his acute illness. - Restart home atenolol, add norvasc 10mg  for persistently elevated BP, aiming not to reduce pressure too quickly. NKDA.  - Holding vasotec due to suspected AKI  - Telemetry reviewed, showing only PVC. Will DC tele monitoring to facilitate OOB.  Hypokalemia: Mild - Continue monitoring    Elevated AST: AST mildly elevated at 45.  - Recheck CMP ordered, not done. Will reorder.   BPH - Continue terazosin  Constipation:  - Started bowel regimen with improvement  DVT prophylaxis: Lovenox Code Status: Full Family Communication: Wife by phone  Disposition Plan: Hopeful for DC home 1/13 after 4th remdesivir dose.  Consultants:   None  Procedures:   CCP  Antimicrobials:  Remdesivir   Subjective: Coughing spells still severe, intermittent, associated with dyspnea that is moderate. Some chest pain with cough only, not otherwise or exertional. No leg swelling/tenderness.   Objective: Vitals:   05/02/19 0300 05/02/19 0303 05/02/19 0414 05/02/19 0759  BP:   (!) 145/101 (!) 155/91  Pulse:  81 84 79  Resp: (!) 23  20   Temp:   97.7 F (36.5 C) 97.6 F (36.4 C)  TempSrc:   Oral Axillary  SpO2: 90% 92% 93%   Weight:      Height:        Intake/Output Summary (Last 24 hours) at 05/02/2019 1045 Last data filed at 05/02/2019  0127 Gross per 24 hour  Intake 1410 ml  Output 200 ml  Net 1210 ml   Filed Weights   05/01/19 0400  Weight: 88.3 kg   Gen: 64 y.o. male in no distress Pulm: Nonlabored at rest. Crackles improved at L > R base. CV: Regular rate and rhythm. No murmur, rub, or gallop. No JVD, no dependent edema. GI: Abdomen soft, non-tender, non-distended, with  normoactive bowel sounds.  Ext: Warm, no deformities Skin: No rashes, lesions or ulcers on visualized skin. Neuro: Alert and oriented. No focal neurological deficits. Psych: Judgement and insight appear fair. Clouded cognition but oriented. Mood euthymic & affect congruent. Behavior is appropriate.    Data Reviewed: I have personally reviewed following labs and imaging studies  CBC: Recent Labs  Lab 04/30/19 1141  WBC 4.1  NEUTROABS 2.8  HGB 15.4  HCT 48.2  MCV 88.4  PLT [id number]   Basic Metabolic Panel: Recent Labs  Lab 04/30/19 1141 05/02/19 0530  NA 138  --   K 3.4*  --   CL 99  --   CO2 29  --   GLUCOSE 105*  --   BUN 9  --   CREATININE 1.48*  --   CALCIUM 8.7*  --   MG  --  2.2   GFR: Estimated Creatinine Clearance: 55 mL/min (A) (by C-G formula based on SCr of 1.48 mg/dL (H)). Liver Function Tests: Recent Labs  Lab 04/30/19 1141  AST 45*  ALT 22  ALKPHOS 66  BILITOT 0.7  PROT 7.6  ALBUMIN 3.1*   Recent Labs  Lab 04/30/19 1141  LIPASE 25   Anemia Panel: Recent Labs    04/30/19 1230 05/02/19 0530  FERRITIN 572* 387*   Urine analysis:    Component Value Date/Time   COLORURINE YELLOW 04/30/2019 [place] 04/30/2019 1325   LABSPEC 1.019 04/30/2019 1325   PHURINE 6.0 04/30/2019 1325   GLUCOSEU NEGATIVE 04/30/2019 1325   HGBUR NEGATIVE 04/30/2019 1325   BILIRUBINUR NEGATIVE 04/30/2019 1325   KETONESUR 20 (A) 04/30/2019 1325   PROTEINUR 100 (A) 04/30/2019 1325   NITRITE NEGATIVE 04/30/2019 1325   LEUKOCYTESUR NEGATIVE 04/30/2019 1325   Recent Results (from the past 240 hour(s))  Blood Culture (routine x 2)     Status: None (Preliminary result)   Collection Time: 04/30/19  1:15 PM   Specimen: BLOOD  Result Value Ref Range Status   Specimen Description BLOOD LEFT ANTECUBITAL  Final   Special Requests   Final    BOTTLES DRAWN AEROBIC AND ANAEROBIC Blood Culture adequate volume   Culture   Final    NO GROWTH < 24 HOURS Performed  at [facility], [place] [address] [place], [place] [address]    Report Status PENDING  Incomplete  Blood Culture (routine x 2)     Status: None (Preliminary result)   Collection Time: 04/30/19  3:50 PM   Specimen: BLOOD  Result Value Ref Range Status   Specimen Description BLOOD RIGHT ANTECUBITAL  Final   Special Requests   Final    BOTTLES DRAWN AEROBIC AND ANAEROBIC Blood Culture adequate volume   Culture   Final    NO GROWTH < 24 HOURS Performed at [facility], [place] [address], [place], [place] [address]    Report Status PENDING  Incomplete  Respiratory Panel by RT PCR (Flu A&B, Covid) - Nasopharyngeal Swab     Status: Abnormal   Collection Time: 04/30/19  3:58 PM  Specimen: Nasopharyngeal Swab  Result Value Ref Range Status   SARS Coronavirus 2 by RT PCR POSITIVE (A) NEGATIVE Final    Comment: RESULT CALLED TO, READ BACK BY AND VERIFIED WITH: Benard Halsted, ED RN AT 1914 ON 04/30/19 BY C. JESSUP, MT. (NOTE) SARS-CoV-2 target nucleic acids are DETECTED. SARS-CoV-2 RNA is generally detectable in upper respiratory specimens  during the acute phase of infection. Positive results are indicative of the presence of the identified virus, but do not rule out bacterial infection or co-infection with other pathogens not detected by the test. Clinical correlation with patient history and other diagnostic information is necessary to determine patient infection status. The expected result is Negative. Fact Sheet for Patients:  PinkCheek.be Fact Sheet for Healthcare Providers: GravelBags.it This test is not yet approved or cleared by the [place] FDA and  has been authorized for detection and/or diagnosis of SARS-CoV-2 by FDA under an Emergency Use Authorization (EUA).  This EUA will remain in effect (meaning this te st can be used) for the duration of  the COVID-19 declaration under Section 564(b)(1) of the Act, 21  U.S.C. section 360bbb-3(b)(1), unless the authorization is terminated or revoked sooner.    Influenza A by PCR NEGATIVE NEGATIVE Final   Influenza B by PCR NEGATIVE NEGATIVE Final    Comment: (NOTE) The Xpert Xpress SARS-CoV-2/FLU/RSV assay is intended as an aid in  the diagnosis of influenza from Nasopharyngeal swab specimens and  should not be used as a sole basis for treatment. Nasal washings and  aspirates are unacceptable for Xpert Xpress SARS-CoV-2/FLU/RSV  testing. Fact Sheet for Patients: PinkCheek.be Fact Sheet for Healthcare Providers: GravelBags.it This test is not yet approved or cleared by the [place] FDA and  has been authorized for detection and/or diagnosis of SARS-CoV-2 by  FDA under an Emergency Use Authorization (EUA). This EUA will remain  in effect (meaning this test can be used) for the duration of the  Covid-19 declaration under Section 564(b)(1) of the Act, 21  U.S.C. section 360bbb-3(b)(1), unless the authorization is  terminated or revoked. Performed at [facility], [place] [address], [place], [place] [address]       Radiology Studies: DG Chest Portable 1 View  Result Date: 04/30/2019 CLINICAL DATA:  Cough, dyspnea, COVID-19 positive EXAM: PORTABLE CHEST 1 VIEW COMPARISON:  None. FINDINGS: Low lung volumes. Normal heart size. Normal mediastinal contour. No pneumothorax. No pleural effusion. Mild hazy left lung base opacity. No pulmonary edema. IMPRESSION: Low lung volumes. Mild hazy left lung base opacity, which could represent pneumonia or atelectasis. Electronically Signed   By: Ilona Sorrel M.D.   On: 04/30/2019 12:15    Scheduled Meds: . albuterol  2 puff Inhalation Q6H  . vitamin C  500 mg Oral Daily  . atenolol  100 mg Oral Daily  . dexamethasone (DECADRON) injection  6 mg Intravenous Q12H  . enoxaparin (LOVENOX) injection  40 mg Subcutaneous Q24H  . feeding supplement (ENSURE  ENLIVE)  237 mL Oral TID BM  . mouth rinse  15 mL Mouth Rinse BID  . pantoprazole  40 mg Oral BID AC  . sodium chloride flush  3 mL Intravenous Q12H  . terazosin  10 mg Oral QHS  . zinc sulfate  220 mg Oral Daily   Continuous Infusions: . remdesivir 100 mg in NS 100 mL 100 mg (05/01/19 1641)     LOS: 2 days   Time spent: 25 minutes.  Patrecia Pour, MD [facility] [email or web address]  05/02/2019, 10:45 AM

## 2019-05-03 LAB — COMPREHENSIVE METABOLIC PANEL
ALT: 24 U/L (ref 0–44)
AST: 29 U/L (ref 15–41)
Albumin: 3.1 g/dL — ABNORMAL LOW (ref 3.5–5.0)
Alkaline Phosphatase: 61 U/L (ref 38–126)
Anion gap: 11 (ref 5–15)
BUN: 25 mg/dL — ABNORMAL HIGH (ref 8–23)
CO2: 24 mmol/L (ref 22–32)
Calcium: 9 mg/dL (ref 8.9–10.3)
Chloride: 105 mmol/L (ref 98–111)
Creatinine, Ser: 1.2 mg/dL (ref 0.61–1.24)
GFR calc Af Amer: >60 mL/min (ref >60)
GFR calc non Af Amer: >60 mL/min (ref >60)
Glucose, Bld: 132 mg/dL — ABNORMAL HIGH (ref 70–99)
Potassium: 4.3 mmol/L (ref 3.5–5.1)
Sodium: 140 mmol/L (ref 135–145)
Total Bilirubin: 0.3 mg/dL (ref 0.3–1.2)
Total Protein: 7.1 g/dL (ref 6.5–8.1)

## 2019-05-03 LAB — CBC
HCT: 41.4 % (ref 39.0–52.0)
Hemoglobin: 13.5 g/dL (ref 13.0–17.0)
MCH: 28.5 pg (ref 26.0–34.0)
MCHC: 32.6 g/dL (ref 30.0–36.0)
MCV: 87.3 fL (ref 80.0–100.0)
Platelets: 316 10*3/uL (ref 150–400)
RBC: 4.74 MIL/uL (ref 4.22–5.81)
RDW: 13.7 % (ref 11.5–15.5)
WBC: 12.8 10*3/uL — ABNORMAL HIGH (ref 4.0–10.5)
nRBC: 0 % (ref 0.0–0.2)

## 2019-05-03 LAB — D-DIMER, QUANTITATIVE: D-Dimer, Quant: 0.52 ug/mL-FEU — ABNORMAL HIGH (ref 0.00–0.50)

## 2019-05-03 LAB — C-REACTIVE PROTEIN: CRP: 2.8 mg/dL — ABNORMAL HIGH (ref <1.0)

## 2019-05-03 MED ORDER — AMLODIPINE BESYLATE 10 MG PO TABS: 10.0000 mg | ORAL_TABLET | Freq: Every day | ORAL | 0 refills | Status: AC

## 2019-05-03 MED ORDER — ZINC SULFATE 220 (50 ZN) MG PO CAPS: 220.0000 mg | ORAL_CAPSULE | Freq: Every day | ORAL | 0 refills | Status: AC

## 2019-05-03 MED ORDER — ASCORBIC ACID 500 MG PO TABS: 500.0000 mg | ORAL_TABLET | Freq: Every day | ORAL | 0 refills | Status: AC

## 2019-05-03 NOTE — Plan of Care (Signed)
Went over [place] and DC papers with pt and wife. All questions asked and all questions answered.

## 2019-05-03 NOTE — Progress Notes (Signed)
   05/03/19 1000  Family/Significant Other Communication  Family/Significant Other Update Called;Updated  Called and gave an update on POC as pt requested. All questions asked and all questions answered.

## 2019-05-03 NOTE — Plan of Care (Signed)

## 2019-05-03 NOTE — Evaluation (Signed)
Occupational Therapy Evaluation And Discharge Patient Details Name: Russell Prince MRN: [id number] DOB: [birth year] Today's Date: 05/03/2019    History of Present Illness Pt is a 64 y.o. male with a history of HTN, HLD, GERD, and anemia who presented with coughing and worsening dyspnea for the previous 2 weeks, having been diagnosed with covid-19 at the [place] 1/6. In the ED on 1/10 he was tachycardic, tachypneic, hypertensive and hypoxic requiring 2L O2.    Clinical Impression   Pt independent in ADL and mobility prior to admission. 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 Today Pt was able to perform all aspects of transfers and mobility, LB and UB ADL (including sponge bath at the sink), standing grooming, at independent level. No SOB noticed, SpO2 WFL on RA. Educated on energy conservation techniques for recovery. Evaluation and education complete, no further OT needs at this time.     Follow Up Recommendations  No OT follow up    Equipment Recommendations  None recommended by OT    Recommendations for Other Services       Precautions / Restrictions Precautions Precautions: None Precaution Comments: none      Mobility Bed Mobility Overal bed mobility: Independent                Transfers Overall transfer level: Independent Equipment used: None                  Balance Overall balance assessment: Independent                                         ADL either performed or assessed with clinical judgement   ADL Overall ADL's : Modified independent                                             Vision Baseline Vision/History: Wears glasses Wears Glasses: At all times Patient Visual Report: No change from baseline Vision Assessment?: No apparent visual deficits     Perception     Praxis      Pertinent Vitals/Pain Pain Assessment: No/denies pain     Hand Dominance     Extremity/Trunk  Assessment Upper Extremity Assessment Upper Extremity Assessment: Overall WFL for tasks assessed   Lower Extremity Assessment Lower Extremity Assessment: Overall WFL for tasks assessed   Cervical / Trunk Assessment Cervical / Trunk Assessment: Normal   Communication Communication Communication: No difficulties   Cognition Arousal/Alertness: Awake/alert Behavior During Therapy: WFL for tasks assessed/performed Overall Cognitive Status: Within Functional Limits for tasks assessed                                     General Comments  educated in energy conservation strategies    Exercises General Exercises - Lower Extremity Hip Flexion/Marching: AROM;10 reps;Standing Toe Raises: 10 reps;Standing Heel Raises: AROM;10 reps;Standing Mini-Sqauts: AROM;10 reps;Standing Other Exercises Other Exercises: reviewed IS and  fly=utter to be performed 10 x's /hour   Shoulder Instructions      Home Living Family/patient expects to be discharged to:: Private residence Living Arrangements: Spouse/significant other Available Help at Discharge: Family Type of Home: House Home Access: Stairs to enter [employer] of Steps: 2  Home Layout: One level     Bathroom Shower/Tub: Teacher, early years/pre: Standard     Home Equipment: None          Prior Functioning/Environment Level of Independence: Independent        Comments: makes and designs custom tshirts        OT Problem List: Decreased activity tolerance;Cardiopulmonary status limiting activity      OT Treatment/Interventions:      OT Goals(Current goals can be found in the care plan section) Acute Rehab OT Goals Patient Stated Goal: to go home home today OT Goal Formulation: With patient Time For Goal Achievement: 05/17/19 Potential to Achieve Goals: Good  OT Frequency:     Barriers to D/C:            Co-evaluation              AM-PAC OT "6 Clicks" Daily Activity      Outcome Measure Help from another person eating meals?: None Help from another person taking care of personal grooming?: None Help from another person toileting, which includes using toliet, bedpan, or urinal?: None Help from another person bathing (including washing, rinsing, drying)?: None Help from another person to put on and taking off regular upper body clothing?: None Help from another person to put on and taking off regular lower body clothing?: None 6 Click Score: 24   End of Session Nurse Communication: Mobility status  Activity Tolerance: Patient tolerated treatment well Patient left: with call bell/phone within reach;in bed  OT Visit Diagnosis: Muscle weakness (generalized) (M62.81)                Time: [id number] OT Time Calculation (min): 20 min Charges:  OT General Charges $OT Visit: 1 Visit OT Evaluation $OT Eval Low Complexity: [place] OTR/L Acute Rehabilitation Services Pager: [phone] Office: [phone]  Mickel Baas 05/03/2019, 11:26 AM

## 2019-05-03 NOTE — Discharge Summary (Signed)
Physician Discharge Summary  Russell Prince [id number] DOB: 03/18/1956 DOA: 04/30/2019  PCP: Russell Cea, MD  Admit date: 04/30/2019 Discharge date: 05/03/2019  Time spent: 40 minutes  Recommendations for Outpatient Follow-up:  1. Outpatient cbc/cmp 2.  Follow up outpatient remdesivir 3. Quarantine per [employer] guidelines 4. Follow blood pressure outpatient 5. Follow LFT's outpatient 6. CXR outpatient  Discharge Diagnoses:  Principal Problem:   Pneumonia due to COVID-19 virus Active Problems:   Acute respiratory failure with hypoxia (HCC)   BPH (benign prostatic hyperplasia)   AKI (acute kidney injury) ([name])   Hypertensive urgency   Discharge Condition: stable  Diet recommendation: heart healthy  Filed Weights   05/01/19 0400  Weight: 88.3 kg    History of present illness:  Russell Prince is Russell Prince 64 y.o. male with Russell Prince history of HTN, HLD, GERD, and anemia who presented with coughing and worsening dyspnea for the previous 2 weeks, having been diagnosed with covid-19 at the [place] 1/6. In the ED on 1/10 he was tachycardic, tachypneic, hypertensive and hypoxic requiring 2L O2. SARS-CoV-2 confirmed to be positive. Chest x-ray noted low lung volumes with mild left lung base opacity concerning for pneumonia versus atelectasis. Remdesivir and steroids were given, patient admitted to [facility].   He was admitted for COVID pneumonia.  He's improved with steroids, remdesivir, and convalescent plasma.  Currently doing well on RA.  Follow outpatient for last dose of remdesivir.   Hospital Course:  Acute hypoxemic respiratory failure due to covid-19 pneumonia: SARS-CoV-2 PCR positive reportedly 1/6, confirmed 1/10 with negative Ag at that time.  - Continue remdesivir x5 days (1/10 - 1/14), will accelerate dose today and, if hypoxia resolves and patient's mobility improves, can DC 1/13 with plans to complete course in infusion clinic on 1/14.  - s/p steroids, convalescent plasma - quarantine per cdc  guidelines  COVID-19 Labs  Recent Labs    05/02/19 0530 05/03/19 0703  DDIMER 0.66* 0.52*  FERRITIN 387*  --   CRP 6.4* 2.8*    Lab Results  Component Value Date   SARSCOV2NAA POSITIVE (Russell Prince) 04/30/2019   Suspected acute kidney injury: Creatinine 1.48 and BUN 9 on admission, but no previous kidney function labs to compare.Patient denies any previous history of being told that he had kidney problems - improved, follow outpatient  Hypertensive urgency: Acute, improving. Patient's blood pressures noted to be elevated up to 186/106 on admission. Suspect at least in part associated with his acute illness. - Restart home atenolol, add norvasc 10mg  for persistently elevated BP - resume home enalapril as well, follow outpatient  Hypokalemia: Mild - Continue monitoring   Elevated AST: AST mildly elevated at 45. - Recheck CMP ordered, not done. Will reorder.   BPH - Continue terazosin  Constipation:  - Started bowel regimen with improvement  Procedures:  none  Consultations:  none  Discharge Exam: Vitals:   05/03/19 0622 05/03/19 0800  BP: (!) 160/107 (!) 155/107  Pulse:  71  Resp:  18  Temp:  98 F (36.7 C)  SpO2:  92%   Feels well, ready for discharge Discussed poc with wife  General: No acute distress. Cardiovascular: Heart sounds show Russell Prince regular rate, and rhythm.  Lungs: Clear to auscultation bilaterally Abdomen: Soft, nontender, nondistended  Neurological: Alert and oriented 3. Moves all extremities 4 . Cranial nerves II through XII grossly intact. Skin: Warm and dry. No rashes or lesions. Extremities: No clubbing or cyanosis. No edema.   Discharge Instructions   Discharge Instructions  Call MD for:  difficulty breathing, headache or visual disturbances   Complete by: As directed    Call MD for:  extreme fatigue   Complete by: As directed    Call MD for:  persistant dizziness or light-headedness   Complete by: As directed    Call MD  for:  persistant nausea and vomiting   Complete by: As directed    Call MD for:  redness, tenderness, or signs of infection (pain, swelling, redness, odor or green/yellow discharge around incision site)   Complete by: As directed    Call MD for:  severe uncontrolled pain   Complete by: As directed    Call MD for:  temperature >100.4   Complete by: As directed    Diet - low sodium heart healthy   Complete by: As directed    Discharge instructions   Complete by: As directed    You were seen for COVID 19 pneumonia.  You've improved with steroids and remdesivir.    Your first test was positive on 04/26/19 based on discussion with your and your wife.  You should quarantine for 21 days from this test (until 1/27).  Your quarantine can be discontinued when your symptoms have improved without the use of medications.  Call the health department or your PCP with questions.  We started you on amlodipine for your blood pressure.  Continue to watch this closely and follow up with your PCP for follow up blood pressure and labs after your quarantine has been completed.   Return for new, recurrent, or worsening symptoms.  Please ask your PCP to request records from this hospitalization so they know what was done and what the next steps will be.   Increase activity slowly   Complete by: As directed      Allergies as of 05/03/2019      Reactions   Decadron [dexamethasone] Itching, Other (See Comments)   Pt states he gets Russell Prince feeling of heat and itching in groin area and head (with IVP)      Medication List    TAKE these medications   amLODipine 10 MG tablet Commonly known as: NORVASC Take 1 tablet (10 mg total) by mouth daily. Start taking on: May 04, 2019   ascorbic acid 500 MG tablet Commonly known as: VITAMIN C Take 1 tablet (500 mg total) by mouth daily. Start taking on: May 04, 2019   aspirin EC 81 MG tablet Take 81 mg by mouth daily.   atenolol 100 MG tablet Commonly known as:  TENORMIN Take 100 mg by mouth daily.   enalapril 20 MG tablet Commonly known as: VASOTEC Take 20 mg by mouth daily.   pantoprazole 40 MG tablet Commonly known as: PROTONIX Take 40 mg by mouth 2 (two) times daily before Clint Biello meal.   sildenafil 50 MG tablet Commonly known as: VIAGRA Take 25 mg by mouth daily as needed for erectile dysfunction.   terazosin 10 MG capsule Commonly known as: HYTRIN Take 10 mg by mouth at bedtime.   zinc sulfate 220 (50 Zn) MG capsule Take 1 capsule (220 mg total) by mouth daily. Start taking on: May 04, 2019      Allergies  Allergen Reactions  . Decadron [Dexamethasone] Itching and Other (See Comments)    Pt states he gets Marymargaret Kirker feeling of heat and itching in groin area and head (with IVP)      The results of significant diagnostics from this hospitalization (including imaging, microbiology, ancillary and laboratory) are listed  below for reference.    Significant Diagnostic Studies: DG Chest Portable 1 View  Result Date: 04/30/2019 CLINICAL DATA:  Cough, dyspnea, COVID-19 positive EXAM: PORTABLE CHEST 1 VIEW COMPARISON:  None. FINDINGS: Low lung volumes. Normal heart size. Normal mediastinal contour. No pneumothorax. No pleural effusion. Mild hazy left lung base opacity. No pulmonary edema. IMPRESSION: Low lung volumes. Mild hazy left lung base opacity, which could represent pneumonia or atelectasis. Electronically Signed   By: Ilona Sorrel M.D.   On: 04/30/2019 12:15    Microbiology: Recent Results (from the past 240 hour(s))  Blood Culture (routine x 2)     Status: None (Preliminary result)   Collection Time: 04/30/19  1:15 PM   Specimen: BLOOD  Result Value Ref Range Status   Specimen Description BLOOD LEFT ANTECUBITAL  Final   Special Requests   Final    BOTTLES DRAWN AEROBIC AND ANAEROBIC Blood Culture adequate volume   Culture   Final    NO GROWTH 3 DAYS Performed at [facility], [address], [place], [place] [address]     Report Status PENDING  Incomplete  Blood Culture (routine x 2)     Status: None (Preliminary result)   Collection Time: 04/30/19  3:50 PM   Specimen: BLOOD  Result Value Ref Range Status   Specimen Description BLOOD RIGHT ANTECUBITAL  Final   Special Requests   Final    BOTTLES DRAWN AEROBIC AND ANAEROBIC Blood Culture adequate volume   Culture   Final    NO GROWTH 3 DAYS Performed at [facility], [place] [address], [place], [place] [address]    Report Status PENDING  Incomplete  Respiratory Panel by RT PCR (Flu Latisa Belay&B, Covid) - Nasopharyngeal Swab     Status: Abnormal   Collection Time: 04/30/19  3:58 PM   Specimen: Nasopharyngeal Swab  Result Value Ref Range Status   SARS Coronavirus 2 by RT PCR POSITIVE (Lajean Boese) NEGATIVE Final    Comment: RESULT CALLED TO, READ BACK BY AND VERIFIED WITH: Benard Halsted, ED RN AT 1914 ON 04/30/19 BY C. JESSUP, MT. (NOTE) SARS-CoV-2 target nucleic acids are DETECTED. SARS-CoV-2 RNA is generally detectable in upper respiratory specimens  during the acute phase of infection. Positive results are indicative of the presence of the identified virus, but do not rule out bacterial infection or co-infection with other pathogens not detected by the test. Clinical correlation with patient history and other diagnostic information is necessary to determine patient infection status. The expected result is Negative. Fact Sheet for Patients:  PinkCheek.be Fact Sheet for Healthcare Providers: GravelBags.it This test is not yet approved or cleared by the [place] FDA and  has been authorized for detection and/or diagnosis of SARS-CoV-2 by FDA under an Emergency Use Authorization (EUA).  This EUA will remain in effect (meaning this te st can be used) for the duration of  the COVID-19 declaration under Section 564(b)(1) of the Act, 21 U.S.C. section 360bbb-3(b)(1), unless the authorization is terminated or  revoked sooner.    Influenza Treniyah Lynn by PCR NEGATIVE NEGATIVE Final   Influenza B by PCR NEGATIVE NEGATIVE Final    Comment: (NOTE) The Xpert Xpress SARS-CoV-2/FLU/RSV assay is intended as an aid in  the diagnosis of influenza from Nasopharyngeal swab specimens and  should not be used as Kathline Banbury sole basis for treatment. Nasal washings and  aspirates are unacceptable for Xpert Xpress SARS-CoV-2/FLU/RSV  testing. Fact Sheet for Patients: PinkCheek.be Fact Sheet for Healthcare Providers: GravelBags.it This test  is not yet approved or cleared by the [place] and  has been authorized for detection and/or diagnosis of SARS-CoV-2 by  FDA under an Emergency Use Authorization (EUA). This EUA will remain  in effect (meaning this test can be used) for the duration of the  Covid-19 declaration under Section 564(b)(1) of the Act, 21  U.S.C. section 360bbb-3(b)(1), unless the authorization is  terminated or revoked. Performed at [facility], [place] [address] [place], [place] [address]      Labs: Basic Metabolic Panel: Recent Labs  Lab 04/30/19 1141 05/02/19 0530 05/03/19 0703  NA 138 140 140  K 3.4* 4.3 4.3  CL 99 105 105  CO2 29 23 24   GLUCOSE 105* 124* 132*  BUN 9 24* 25*  CREATININE 1.48* 1.18 1.20  CALCIUM 8.7* 9.0 9.0  MG  --  2.2  --    Liver Function Tests: Recent Labs  Lab 04/30/19 1141 05/02/19 0530 05/03/19 0703  AST 45* 30 29  ALT 22 21 24   ALKPHOS 66 59 61  BILITOT 0.7 0.6 0.3  PROT 7.6 7.3 7.1  ALBUMIN 3.1* 3.2* 3.1*   Recent Labs  Lab 04/30/19 1141  LIPASE 25   No results for input(s): AMMONIA in the last 168 hours. CBC: Recent Labs  Lab 04/30/19 1141 05/03/19 0822  WBC 4.1 12.8*  NEUTROABS 2.8  --   HGB 15.4 13.5  HCT 48.2 41.4  MCV 88.4 87.3  PLT 199 316   Cardiac Enzymes: No results for input(s): CKTOTAL, CKMB, CKMBINDEX, TROPONINI in the last 168 hours. BNP: BNP (last 3  results) Recent Labs    04/30/19 1230  BNP 35.8    ProBNP (last 3 results) No results for input(s): PROBNP in the last 8760 hours.  CBG: No results for input(s): GLUCAP in the last 168 hours.     Signed:  Fayrene Helper MD.  [facility] 05/03/2019, 12:49 PM

## 2019-05-03 NOTE — Evaluation (Addendum)
Physical Therapy Evaluation Patient Details Name: Russell Prince MRN: [id number] DOB: 04-08-1956 Today's Date: 05/03/2019   History of Present Illness  Pt is a 64 y.o. male with a history of HTN, HLD, GERD, and anemia who presented with coughing and worsening dyspnea for the previous 2 weeks, having been diagnosed with covid-19 at the [place] 1/6. In the ED on 1/10 he was tachycardic, tachypneic, hypertensive and hypoxic requiring 2L O2.   Clinical Impression  The patient reports that he  Is up ad lib in room. Has not ambulated in hall until this visit. Patient is independently ambulating on RA with SPO2(ear sensor) >95% throughout and did not drop any further.  Provided instruction for HEP and instructed to continue IS and flutter. Patient also  Has a treadmill at home.   PT will sign off.  Follow Up Recommendations No PT follow up    Equipment Recommendations  None recommended by PT    Recommendations for Other Services       Precautions / Restrictions Precautions Precaution Comments: none      Mobility  Bed Mobility Overal bed mobility: Independent                Transfers Overall transfer level: Independent                  Ambulation/Gait Ambulation/Gait assistance: Independent Gait Distance (Feet): 260 Feet Assistive device: None Gait Pattern/deviations: WFL(Within Functional Limits)   Gait velocity interpretation: 1.31 - 2.62 ft/sec, indicative of limited community Conservation officer, historic buildings [place] (Stroke Patients Only)       Balance Overall balance assessment: Independent                                           Pertinent Vitals/Pain Pain Assessment: No/denies pain    Home Living Family/patient expects to be discharged to:: Private residence Living Arrangements: Spouse/significant other Available Help at Discharge: Family Type of Home: House Home Access: Stairs to enter      Home Layout:one level Home Equipment: None      Prior Function Level of Independence: Independent               Hand Dominance        Extremity/Trunk Assessment        Lower Extremity Assessment Lower Extremity Assessment: Overall WFL for tasks assessed    Cervical / Trunk Assessment Cervical / Trunk Assessment: Normal  Communication   Communication: No difficulties  Cognition Arousal/Alertness: Awake/alert Behavior During Therapy: WFL for tasks assessed/performed Overall Cognitive Status: Within Functional Limits for tasks assessed                                        General Comments      Exercises General Exercises - Lower Extremity Hip Flexion/Marching: AROM;10 reps;Standing Toe Raises: 10 reps;Standing Heel Raises: AROM;10 reps;Standing Mini-Sqauts: AROM;10 reps;Standing Other Exercises Other Exercises: reviewed IS and  fly=utter to be performed 10 x's /hour   Assessment/Plan    PT Assessment Patent does not need any further PT services  PT Problem List         PT Treatment Interventions      PT Goals (Current  goals can be found in the Care Plan section)  Acute Rehab PT Goals Patient Stated Goal: to go home home today PT Goal Formulation: All assessment and education complete, DC therapy    Frequency     Barriers to discharge        Co-evaluation               AM-PAC PT "6 Clicks" Mobility  Outcome Measure Help needed turning from your back to your side while in a flat bed without using bedrails?: None Help needed moving from lying on your back to sitting on the side of a flat bed without using bedrails?: None Help needed moving to and from a bed to a chair (including a wheelchair)?: None Help needed standing up from a chair using your arms (e.g., wheelchair or bedside chair)?: None Help needed to walk in hospital room?: None Help needed climbing 3-5 steps with a railing? : None 6 Click Score: 24    End of  Session   Activity Tolerance: Patient tolerated treatment well Patient left: in bed;with nursing/sitter in room Nurse Communication: Mobility status PT Visit Diagnosis: Muscle weakness (generalized) (M62.81);Difficulty in walking, not elsewhere classified (R26.2)    Time: [id number] PT Time Calculation (min) (ACUTE ONLY): 20 min   Charges:   PT Evaluation $PT Eval Low Complexity: [facility] Acute Rehabilitation Services Pager [phone] Office [phone]   Claretha Cooper 05/03/2019, 8:49 AM

## 2019-05-03 NOTE — Progress Notes (Signed)
Patient scheduled for outpatient Remdesivir infusion at 1PM on Thursday 1/14. Please advise them to report to [facility] at [address]  Drive to the security guard and tell them you are here for an infusion. They will direct you to the front entrance where we will come and get you.  For questions call [phone].  Thanks

## 2019-05-03 NOTE — Progress Notes (Signed)
After dose of Decadron 6mg  IVP given, patient complained he felt a "heat" to both his groin area and head as well as itching to both areas. RN asked if this has happened before with said drug and pt said, "yes." RN informed pt he likely has a mild drug allergy. Dr. Vanita Ingles notified of the above and stated he would switch IVP to PO. Pt updated on the change and is aware to notify his nurse if he experiences any negative reaction to the PO drug.

## 2019-05-04 ENCOUNTER — Ambulatory Visit (HOSPITAL_COMMUNITY)
Admission: RE | Admit: 2019-05-04 | Discharge: 2019-05-04 | Disposition: A | Discharge status: Home / Self Care | Payer: No Typology Code available for payment source | Source: Ambulatory Visit | Attending: Pulmonary Disease | Admitting: Pulmonary Disease

## 2019-05-04 VITALS — BP 136/87 | HR 58 | Temp 98.2°F | Resp 18

## 2019-05-04 DIAGNOSIS — J1282 Pneumonia due to coronavirus disease 2019: Secondary | ICD-10-CM | POA: Insufficient documentation

## 2019-05-04 DIAGNOSIS — U071 COVID-19: Secondary | ICD-10-CM | POA: Insufficient documentation

## 2019-05-04 MED ORDER — DIPHENHYDRAMINE HCL 50 MG/ML IJ SOLN: 50.0000 mg | Freq: Once | INTRAMUSCULAR | Status: DC | PRN

## 2019-05-04 MED ORDER — FAMOTIDINE IN NACL 20-0.9 MG/50ML-% IV SOLN: 20.0000 mg | Freq: Once | INTRAVENOUS | Status: DC | PRN

## 2019-05-04 MED ORDER — METHYLPREDNISOLONE SODIUM SUCC 125 MG IJ SOLR: 125.0000 mg | Freq: Once | INTRAMUSCULAR | Status: DC | PRN

## 2019-05-04 MED ORDER — ALBUTEROL SULFATE HFA 108 (90 BASE) MCG/ACT IN AERS: 2.0000 | INHALATION_SPRAY | Freq: Once | RESPIRATORY_TRACT | Status: DC | PRN

## 2019-05-04 MED ORDER — SODIUM CHLORIDE 0.9 % IV SOLN
INTRAVENOUS | Status: AC
  Administered 2019-05-04: 100 mg via INTRAVENOUS
  Filled 2019-05-04: qty 20

## 2019-05-04 MED ORDER — SODIUM CHLORIDE 0.9 % IV SOLN: INTRAVENOUS | Status: DC | PRN

## 2019-05-04 MED ORDER — SODIUM CHLORIDE 0.9 % IV SOLN: 100.0000 mg | Freq: Once | INTRAVENOUS | Status: AC

## 2019-05-04 MED ORDER — EPINEPHRINE 0.3 MG/0.3ML IJ SOAJ: 0.3000 mg | Freq: Once | INTRAMUSCULAR | Status: DC | PRN

## 2019-05-04 NOTE — Discharge Instructions (Signed)
10 Things You Can Do to Manage Your COVID-19 Symptoms at Home If you have possible or confirmed COVID-19: 1. Stay home from work and school. And stay away from other public places. If you must go out, avoid using any kind of public transportation, ridesharing, or taxis. 2. Monitor your symptoms carefully. If your symptoms get worse, call your healthcare provider immediately. 3. Get rest and stay hydrated. 4. If you have a medical appointment, call the healthcare provider ahead of time and tell them that you have or may have COVID-19. 5. For medical emergencies, call 911 and notify the dispatch personnel that you have or may have COVID-19. 6. Cover your cough and sneezes with a tissue or use the inside of your elbow. 7. Wash your hands often with soap and water for at least 20 seconds or clean your hands with an alcohol-based hand sanitizer that contains at least 60% alcohol. 8. As much as possible, stay in a specific room and away from other people in your home. Also, you should use a separate bathroom, if available. If you need to be around other people in or outside of the home, wear a mask. 9. Avoid sharing personal items with other people in your household, like dishes, towels, and bedding. 10. Clean all surfaces that are touched often, like counters, tabletops, and doorknobs. Use household cleaning sprays or wipes according to the label instructions. cdc.gov/coronavirus 10/19/2018 This information is not intended to replace advice given to you by your health care provider. Make sure you discuss any questions you have with your health care provider. Document Revised: 03/23/2019 Document Reviewed: 03/23/2019 Elsevier Patient Education  2020 Elsevier Inc.  

## 2019-05-04 NOTE — Progress Notes (Signed)
  Diagnosis: COVID-19  Physician: Joya Gaskins  Procedure: Covid Infusion Clinic Med: remdesivir infusion.  Complications: No immediate complications noted.  Discharge: Discharged home   Baxter Hire 05/04/2019

## 2019-05-07 LAB — CULTURE, BLOOD (ROUTINE X 2)
Culture: NO GROWTH
Culture: NO GROWTH
Special Requests: ADEQUATE
Special Requests: ADEQUATE

## 2019-05-09 NOTE — ED Provider Notes (Signed)
.  Critical Care Performed by: Lorayne Bender, PA-C Authorized by: Lorayne Bender, PA-C   Critical care provider statement:    Critical care time (minutes):  45   Critical care time was exclusive of:  Separately billable procedures and treating other patients   Critical care was necessary to treat or prevent imminent or life-threatening deterioration of the following conditions:  Respiratory failure   Critical care was time spent personally by me on the following activities:  Development of treatment plan with patient or surrogate, discussions with consultants, examination of patient, evaluation of patient's response to treatment, obtaining history from patient or surrogate, ordering and performing treatments and interventions, ordering and review of laboratory studies, ordering and review of radiographic studies, pulse oximetry, re-evaluation of patient's condition and review of old charts   I assumed direction of critical care for this patient from another provider in my specialty: no       Lorayne Bender, PA-C 05/09/19 [id number]

## 2019-05-10 NOTE — Progress Notes (Signed)
Spoke to [place] at [facility] to give report for pt. She had no further questions.

## 2020-03-04 ENCOUNTER — Emergency Department (HOSPITAL_COMMUNITY): Payer: No Typology Code available for payment source

## 2020-03-04 ENCOUNTER — Emergency Department (HOSPITAL_COMMUNITY)
Admission: EM | Admit: 2020-03-04 | Discharge: 2020-03-04 | Disposition: A | Discharge status: Home / Self Care | Payer: No Typology Code available for payment source | Attending: Emergency Medicine | Admitting: Emergency Medicine

## 2020-03-04 ENCOUNTER — Other Ambulatory Visit: Payer: Self-pay

## 2020-03-04 ENCOUNTER — Encounter (HOSPITAL_COMMUNITY): Payer: Self-pay

## 2020-03-04 DIAGNOSIS — I10 Essential (primary) hypertension: Secondary | ICD-10-CM | POA: Insufficient documentation

## 2020-03-04 DIAGNOSIS — Z7982 Long term (current) use of aspirin: Secondary | ICD-10-CM | POA: Diagnosis not present

## 2020-03-04 DIAGNOSIS — R519 Headache, unspecified: Secondary | ICD-10-CM

## 2020-03-04 DIAGNOSIS — Z79899 Other long term (current) drug therapy: Secondary | ICD-10-CM | POA: Insufficient documentation

## 2020-03-04 DIAGNOSIS — I160 Hypertensive urgency: Secondary | ICD-10-CM | POA: Insufficient documentation

## 2020-03-04 LAB — CBC
HCT: 43.9 % (ref 39.0–52.0)
Hemoglobin: 13.8 g/dL (ref 13.0–17.0)
MCH: 28.8 pg (ref 26.0–34.0)
MCHC: 31.4 g/dL (ref 30.0–36.0)
MCV: 91.5 fL (ref 80.0–100.0)
Platelets: 173 10*3/uL (ref 150–400)
RBC: 4.8 MIL/uL (ref 4.22–5.81)
RDW: 15 % (ref 11.5–15.5)
WBC: 6.4 10*3/uL (ref 4.0–10.5)
nRBC: 0 % (ref 0.0–0.2)

## 2020-03-04 LAB — COMPREHENSIVE METABOLIC PANEL
ALT: 19 U/L (ref 0–44)
AST: 20 U/L (ref 15–41)
Albumin: 3.8 g/dL (ref 3.5–5.0)
Alkaline Phosphatase: 67 U/L (ref 38–126)
Anion gap: 9 (ref 5–15)
BUN: 14 mg/dL (ref 8–23)
CO2: 28 mmol/L (ref 22–32)
Calcium: 9.1 mg/dL (ref 8.9–10.3)
Chloride: 105 mmol/L (ref 98–111)
Creatinine, Ser: 1.37 mg/dL — ABNORMAL HIGH (ref 0.61–1.24)
GFR, Estimated: 57 mL/min — ABNORMAL LOW (ref >60)
Glucose, Bld: 90 mg/dL (ref 70–99)
Potassium: 4.5 mmol/L (ref 3.5–5.1)
Sodium: 142 mmol/L (ref 135–145)
Total Bilirubin: 0.5 mg/dL (ref 0.3–1.2)
Total Protein: 7 g/dL (ref 6.5–8.1)

## 2020-03-04 LAB — TROPONIN I (HIGH SENSITIVITY): Troponin I (High Sensitivity): <2 ng/L (ref <18)

## 2020-03-04 MED ORDER — HYDRALAZINE HCL 20 MG/ML IJ SOLN
10.0000 mg | INTRAMUSCULAR | Status: AC
  Administered 2020-03-04: 10 mg via INTRAVENOUS
  Filled 2020-03-04: qty 1

## 2020-03-04 MED ORDER — AMLODIPINE BESYLATE 5 MG PO TABS
10.0000 mg | ORAL_TABLET | Freq: Once | ORAL | Status: AC
  Administered 2020-03-04: 10 mg via ORAL
  Filled 2020-03-04: qty 2

## 2020-03-04 NOTE — ED Triage Notes (Signed)
Pt arrived via walk in, c/o bad headache. States his BP has been running high for a couple days, hx of HTN, states he has been taking his medications as prescribed.

## 2020-03-04 NOTE — ED Provider Notes (Signed)
[facility] DEPT Provider Note   CSN: [id number] Arrival date & time: 03/04/20  1031     History Chief Complaint  Patient presents with  . Hypertension    Russell Prince is a 64 y.o. male.  64 year old male with past medical history including hypertension, hyperlipidemia, GERD who presents with hypertension and headache.  Patient states that for the past few days, he has noticed several elevated blood pressure values which is abnormal for him.  He is compliant with his blood pressure medications.  He has had a gradually worsening headache today.  He denies any associated vision changes, vomiting, chest pain, shortness of breath, or focal weakness/numbness.  He went to a regularly scheduled [facility] clinic appointment and when they checked his blood pressure, they were concerned at how elevated it was and referred him to the ER.  He denies any recent changes to his medications. He has had similar headaches in the distant past.  The history is provided by the patient.  Hypertension       Past Medical History:  Diagnosis Date  . Anemia   . Fatty liver   . GERD (gastroesophageal reflux disease)   . HLD (hyperlipidemia)   . HTN (hypertension)     Patient Active Problem List   Diagnosis Date Noted  . Pneumonia due to COVID-19 virus 04/30/2019  . Acute respiratory failure with hypoxia ([place]) 04/30/2019  . BPH (benign prostatic hyperplasia) 04/30/2019  . AKI (acute kidney injury) ([place]) 04/30/2019  . Hypertensive urgency 04/30/2019    Past Surgical History:  Procedure Laterality Date  . Tooth Implants          Family History  Problem Relation Age of Onset  . Cancer Mother        unknown type  . Diabetes Sister   . Asthma Sister   . Liver disease Sister   . Colon cancer Maternal Uncle   . Stomach cancer Neg Hx   . Esophageal cancer Neg Hx     Social History   Tobacco Use  . Smoking status: Never Smoker  . Smokeless tobacco: Never Used   Vaping Use  . Vaping Use: Never used  Substance Use Topics  . Alcohol use: Never  . Drug use: Never    Home Medications Prior to Admission medications   Medication Sig Start Date End Date Taking? Authorizing Provider  amLODipine (NORVASC) 10 MG tablet Take 1 tablet (10 mg total) by mouth daily. 05/04/19 06/03/19  Elodia Florence., MD  aspirin EC 81 MG tablet Take 81 mg by mouth daily.    [provider]  atenolol (TENORMIN) 100 MG tablet Take 100 mg by mouth daily.    [provider]  enalapril (VASOTEC) 20 MG tablet Take 20 mg by mouth daily.    [provider]  pantoprazole (PROTONIX) 40 MG tablet Take 40 mg by mouth 2 (two) times daily before a meal.    [provider]  sildenafil (VIAGRA) 50 MG tablet Take 25 mg by mouth daily as needed for erectile dysfunction.    [provider]  terazosin (HYTRIN) 10 MG capsule Take 10 mg by mouth at bedtime.    [provider]    Allergies    Decadron [dexamethasone]  Review of Systems   Review of Systems All other systems reviewed and are negative except that which was mentioned in HPI  Physical Exam Updated Vital Signs BP (!) 178/111   Pulse (!) 41   Temp 97.6  F (36.4 C) (Oral)   Resp (!) 8   SpO2 100%   Physical Exam Vitals and nursing note reviewed.  Constitutional:      General: He is not in acute distress.    Appearance: He is well-developed.     Comments: Awake, alert  HENT:     Head: Normocephalic and atraumatic.  Eyes:     Extraocular Movements: Extraocular movements intact.     Conjunctiva/sclera: Conjunctivae normal.     Pupils: Pupils are equal, round, and reactive to light.  Cardiovascular:     Rate and Rhythm: Normal rate and regular rhythm.     Heart sounds: Normal heart sounds. No murmur heard.   Pulmonary:     Effort: Pulmonary effort is normal. No respiratory distress.     Breath sounds: Normal breath sounds.  Abdominal:     General: Bowel  sounds are normal. There is no distension.     Palpations: Abdomen is soft.     Tenderness: There is no abdominal tenderness.  Musculoskeletal:     Cervical back: Neck supple.  Skin:    General: Skin is warm and dry.  Neurological:     Mental Status: He is alert and oriented to person, place, and time.     Cranial Nerves: No cranial nerve deficit.     Motor: No abnormal muscle tone.     Deep Tendon Reflexes: Reflexes are normal and symmetric.     Comments: Fluent speech, normal finger-to-nose testing, negative pronator drift, no clonus 5/5 strength and normal sensation x all 4 extremities  Psychiatric:        Thought Content: Thought content normal.        Judgment: Judgment normal.     ED Results / Procedures / Treatments   Labs (all labs ordered are listed, but only abnormal results are displayed) Labs Reviewed  COMPREHENSIVE METABOLIC PANEL - Abnormal; Notable for the following components:      Result Value   Creatinine, Ser 1.37 (*)    GFR, Estimated 57 (*)    All other components within normal limits  CBC  TROPONIN I (HIGH SENSITIVITY)    EKG EKG Interpretation  Date/Time:  Monday March 04 2020 10:51:00 EST Ventricular Rate:  51 PR Interval:    QRS Duration: 78 QT Interval:  423 QTC Calculation: 390 R Axis:   45 Text Interpretation: Sinus rhythm 12 Lead; [place]-L[place]ar since previous tracing, tachycardia and non-specific T wave changes have resolved no acute ischemic changes Confirmed by Theotis Burrow [phone]) on 03/04/2020 11:03:44 AM   Radiology CT Head Wo Contrast  Result Date: 03/04/2020 CLINICAL DATA:  64 year old male with headache EXAM: CT HEAD WITHOUT CONTRAST TECHNIQUE: Contiguous axial images were obtained from the base of the skull through the vertex without intravenous contrast. COMPARISON:  None. FINDINGS: Brain: No acute intracranial hemorrhage. No midline shift or mass effect. Gray-white differentiation maintained. Unremarkable appearance of the  ventricular system. Vascular: Unremarkable. Skull: No acute fracture.  No aggressive bone lesion identified. Sinuses/Orbits: Unremarkable appearance of the orbits. Mastoid air cells clear. No middle ear effusion. No significant sinus disease. Other: None IMPRESSION: Negative for acute intracranial abnormality Electronically Signed   By: Corrie Mckusick D.O.   On: 03/04/2020 13:33    Procedures Procedures (including critical care time)  Medications Ordered in ED Medications  amLODipine (NORVASC) tablet 10 mg (10 mg Oral Given 03/04/20 1246)  hydrALAZINE (APRESOLINE) injection 10 mg (10 mg Intravenous Given 03/04/20 1547)    ED Course  I  have reviewed the triage vital signs and the nursing notes.  Pertinent labs & imaging results that were available during my care of the patient were reviewed by me and considered in my medical decision making (see chart for details).    MDM Rules/Calculators/A&P                          Neurologically intact on exam, hypertensive but denying any chest pain and EKG without acute ischemic changes.  He had not taken his amlodipine this morning therefore gave amlodipine and later gave single dose of hydralazine IV.  Lab work shows creatinine 1.37 which is similar to previous values, negative troponin.  Head CT negative acute.  I see no evidence of hypertensive emergency.  I briefly discussed with Dr. Oval Linsey, cardiology, who has agreed to see the patient in her hypertension clinic; I appreciate her assistance with the patient's care.  Clinic will contact him for follow-up appointment.  In the meantime, I have encouraged him to continue all medications as prescribed and follow a low-salt diet.  I have extensively reviewed return precautions with patient and his wife and they voiced understanding. Final Clinical Impression(s) / ED Diagnoses Final diagnoses:  Hypertensive urgency  Acute nonintractable headache, unspecified headache type    Rx / DC Orders ED  Discharge Orders    None       Dub Maclellan, Wenda Overland, MD 03/04/20 1705

## 2020-03-04 NOTE — ED Notes (Signed)
Pt's HR consistently in 40's with this RN. Provider made aware.

## 2020-03-18 ENCOUNTER — Ambulatory Visit (INDEPENDENT_AMBULATORY_CARE_PROVIDER_SITE_OTHER): Payer: No Typology Code available for payment source | Admitting: Cardiovascular Disease

## 2020-03-18 ENCOUNTER — Other Ambulatory Visit: Payer: Self-pay

## 2020-03-18 ENCOUNTER — Encounter: Payer: Self-pay | Admitting: Cardiovascular Disease

## 2020-03-18 VITALS — BP 162/100 | HR 56 | Ht 68.5 in | Wt 213.0 lb

## 2020-03-18 DIAGNOSIS — E669 Obesity, unspecified: Secondary | ICD-10-CM

## 2020-03-18 DIAGNOSIS — I10 Essential (primary) hypertension: Secondary | ICD-10-CM

## 2020-03-18 HISTORY — DX: Obesity, unspecified: E66.9

## 2020-03-18 HISTORY — DX: Essential (primary) hypertension: I10

## 2020-03-18 MED ORDER — HYDROCHLOROTHIAZIDE 25 MG PO TABS: 25.0000 mg | ORAL_TABLET | Freq: Every day | ORAL | 3 refills | Status: DC

## 2020-03-18 MED ORDER — HYDROCHLOROTHIAZIDE 25 MG PO TABS: 25.0000 mg | ORAL_TABLET | Freq: Every day | ORAL | 3 refills | Status: AC

## 2020-03-18 NOTE — Progress Notes (Signed)
Hypertension Clinic Initial Assessment:    Date:  03/18/2020   ID:  Russell Prince, DOB 17-Dec-1955, MRN [id number]  PCP:  [facility]  Cardiologist:  Skeet Latch, MD   Referring MD: [facility]   CC: Hypertension  History of Present Illness:    Russell Prince is a 64 y.o. male with a hx of hypertension, GERD, hyperlipidemia, COVID-19 pneumonia here to establish care in the hypertension clinic.  He was diagnosed with hypertension in his 40s.   Initially it was well-controlled.  At times it seems to be higher than others.  He was seen in the ED 03/04/2020 with hypertensive urgency and headache.  He reported compliance with all his antihypertensives but had several days of high blood pressure readings.  The time he was taking amlodipine, atenolol, enalapril, and Terazosin.  In the ED his blood pressure was 171/111 with a heart rate of 41.  He was given a dose of amlodipine and referred to the advanced hypertension clinic.  He has tried to reduce the sodium in his diet.  He notes that he doesn't eat three meals regularly but he does tries eat a lot of vegetables.  He rarely eats fried foods and doesn't eat pork.  He also struggles with stress and anxiety.  He has chronic back pain and gets injections.  Surgery was recommended but he isn't ready.  The pain is constantly there.  He doesn't get as much exercise due to the chronic pain.  He does yard work.  He denies exertional chest pain or dyspnea.  He has occasional chest pain when sitting down.  He rarely drinks alcohol and has no caffeine.  He doesn't use any OTC supplements or NSAIDS.   Falls asleep easily but feels rested when he wakes up in the mornings.  Russell Prince [facility].  He notes that he has been under a lot of stress lately.  He is unable to share his concerns with his wife because he is afraid of upsetting her.   Previous  antihypertensives: Terazosin HCTZ clonidine   Past Medical History:  Diagnosis Date  . Anemia   . Fatty liver   . GERD (gastroesophageal reflux disease)   . HLD (hyperlipidemia)   . HTN (hypertension)   . Obesity (BMI 30.0-34.9) 03/18/2020  . 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 Tooth Implants       Current Medications: Current Meds  Medication Sig  . amLODipine (NORVASC) 10 MG tablet Take 1 tablet (10 mg total) by mouth daily.  Marland Kitchen aspirin EC 81 MG tablet Take 81 mg by mouth daily.  Marland Kitchen atenolol (TENORMIN) 100 MG tablet Take 100 mg by mouth daily.  . enalapril (VASOTEC) 20 MG tablet Take 20 mg by mouth daily.  . pantoprazole (PROTONIX) 40 MG tablet Take 40 mg by mouth 2 (two) times daily before a meal.  . [DISCONTINUED] terazosin (HYTRIN) 10 MG capsule Take 10 mg by mouth at bedtime.     Allergies:   Decadron [dexamethasone]   Social History   Socioeconomic History  . Marital status: Married    Spouse name: Not on file  . Number of children: 4  . Years of education: Not on file  . Highest education level: Not on file  Occupational History  . Not on file  Tobacco Use  . Smoking status: Never Smoker  . Smokeless tobacco: Never Used  Vaping Use  .  Vaping Use: Never used  Substance and Sexual Activity  . Alcohol use: Never  . Drug use: Never  . Sexual activity: Not on file  Other Topics Concern  . Not on file  Social History Narrative  . Not on file   Social Determinants of Health   Financial Resource Strain:   . Difficulty of Paying Living Expenses: Not on file  Food Insecurity:   . Worried About Charity fundraiser in the Last Year: Not on file  . Ran Out of Food in the Last Year: Not on file  Transportation Needs:   . Lack of Transportation (Medical): Not on file  . Lack of Transportation (Non-Medical): Not on file  Physical Activity:   . Days of Exercise per Week: Not on file  . Minutes of  Exercise per Session: Not on file  Stress:   . Feeling of Stress : Not on file  Social Connections:   . Frequency of Communication with Friends and Family: Not on file  . Frequency of Social Gatherings with Friends and Family: Not on file  . Attends Religious Services: Not on file  . Active Member of Clubs or Organizations: Not on file  . Attends Archivist Meetings: Not on file  . Marital Status: Not on file     Family History: The patient's family history includes Asthma in his sister; Cancer in his mother; Colon cancer in his maternal uncle; Diabetes in his sister; Hypertension in his mother and sister; Lung cancer in his mother; Lung disease in his sister; Stroke in his maternal aunt. There is no history of Stomach cancer or Esophageal cancer.  ROS:   Please see the history of present illness.    All other systems reviewed and are negative.  EKGs/Labs/Other Studies Reviewed:    EKG:  EKG is not ordered today.  The ekg ordered 03/05/2020 demonstrates Sinus bradycardia.  Rate 51 bpm.  Recent Labs: 04/30/2019: B Natriuretic Peptide 35.8 05/02/2019: Magnesium 2.2 03/04/2020: ALT 19; BUN 14; Creatinine, Ser 1.37; Hemoglobin 13.8; Platelets 173; Potassium 4.5; Sodium 142   Recent Lipid Panel    Component Value Date/Time   TRIG 99 04/30/2019 1230    Physical Exam:    VS:  BP (!) 162/100   Pulse (!) 56   Ht 5' 8.5" (1.74 m)   Wt 213 lb (96.6 kg)   SpO2 99%   BMI 31.92 kg/m  , BMI Body mass index is 31.92 kg/m. GENERAL:  Well appearing HEENT: Pupils equal round and reactive, fundi not visualized, oral mucosa unremarkable NECK:  No jugular venous distention, waveform within normal limits, carotid upstroke brisk and symmetric, no bruits LUNGS:  Clear to auscultation bilaterally HEART:  RRR.  PMI not displaced or sustained,S1 and S2 within normal limits, no S3, no S4, no clicks, no rubs, no murmurs ABD:  Flat, positive bowel sounds normal in frequency in pitch, no  bruits, no rebound, no guarding, no midline pulsatile mass, no hepatomegaly, no splenomegaly EXT:  2 plus pulses throughout, no edema, no cyanosis no clubbing SKIN:  No rashes no nodules NEURO:  Cranial nerves II through XII grossly intact, motor grossly intact throughout PSYCH:  Cognitively intact, oriented to person place and time   ASSESSMENT:    1. Essential hypertension   2. Resistant hypertension   3. Obesity (BMI 30.0-34.9)     PLAN:    # Resistant hypertension:  Blood pressure remains poorly controlled on three medications.  He reports that in the past  it was better controlled.  We will do a secondary work-up of his hypertension.  We will get renal artery Dopplers.  We will have him hold enalapril and check renal and and aldosterone levels.  He will continue to limit his sodium intake and work on increasing his exercise to 150 minutes weekly.  We have referred him to the prep exercise program through the [facility].  He will continue on his current antihypertensive regimen and we will add hydrochlorothiazide 25 mg daily.  Check a basic metabolic panel in a week.  We will also check a TSH at that time.  He also struggles with stress.  We will have him talk with our care guide for stress management techniques.  He is unclear about whether or not continued services with our clinic will be paid for through the [place].  We will forward this information to his primary care provider and he will be seeing the [facility] clinic today to also clarify whether or not he can continue follow-up with [place].  He was provided with a hypertension clinic booklet and asked to track his blood pressure twice daily.  Secondary Causes of Hypertension  Medications/Herbal: OCP, steroids, stimulants, antidepressants, weight loss medication, immune suppressants, NSAIDs, sympathomimetics, alcohol, caffeine, licorice, ginseng, St. John's wort, chemo (none identified)  Sleep Apnea: snores, no apnea Renal artery stenosis: Check renal  artery Dopplers Hyperaldosteronism: Check renin and aldosterone levels Hyper/hypothyroidism: Check TSH Pheochromocytoma: (Testing not indicated) Cushing's syndrome: (Testing not indicated) Coarctation of the aorta: Blood pressure symmetric, testing not indicated    Disposition:    FU with MD/PharmD in 1 month    Medication Adjustments/Labs and Tests Ordered: Current medicines are reviewed at length with the patient today.  Concerns regarding medicines are outlined above.  Orders Placed This Encounter  Procedures  . Basic metabolic panel  . Aldosterone + renin activity w/ ratio  . VAS US RENAL ARTERY DUPLEX   Meds ordered this encounter  Medications  . DISCONTD: hydrochlorothiazide (HYDRODIURIL) 25 MG tablet    Sig: Take 1 tablet (25 mg total) by mouth daily.    Dispense:  90 tablet    Refill:  3  . hydrochlorothiazide (HYDRODIURIL) 25 MG tablet    Sig: Take 1 tablet (25 mg total) by mouth daily.    Dispense:  90 tablet    Refill:  3     Signed, Skeet Latch, MD  03/18/2020 10:31 AM    [place]

## 2020-03-18 NOTE — Patient Instructions (Addendum)
Medication Instructions:  START HYDROCHLOROTHIAZIDE 25 MG DAILY    Labwork: BMET/RENIN/ALDOSTERONE IN 1 WEEK   HOLD YOUR ENALAPRIL 2 DAYS PRIOR TO LABS, RESUME AFTER LAB WORK DONE   Testing/Procedures: Your physician has requested that you have a renal artery duplex. During this test, an ultrasound is used to evaluate blood flow to the kidneys. Allow one hour for this exam. Do not eat after midnight the day before and avoid carbonated beverages. Take your medications as you usually do.   Follow-Up: 04/17/2020 AT 10:30 AM WITH PHARM D    You will receive a phone call from the PREP exercise and nutrition program to schedule an initial assessment.  Special Instructions:   MONITOR YOUR BLOOD PRESSURE TWICE A DAY, LOG IN THE BOOK PROVIDED. BRING THE BOOK AND YOUR BLOOD PRESSURE MACHINE TO YOUR FOLLOW UP IN 1 MONTH   WILL HAVE CARE GUIDE AMY CALL YOU ABOUT STRESS MANAGEMENT   DASH Eating Plan DASH stands for "Dietary Approaches to Stop Hypertension." The DASH eating plan is a healthy eating plan that has been shown to reduce high blood pressure (hypertension). It may also reduce your risk for type 2 diabetes, heart disease, and stroke. The DASH eating plan may also help with weight loss. What are tips for following this plan?  General guidelines  Avoid eating more than 2,300 mg (milligrams) of salt (sodium) a day. If you have hypertension, you may need to reduce your sodium intake to 1,500 mg a day.  Limit alcohol intake to no more than 1 drink a day for nonpregnant women and 2 drinks a day for men. One drink equals 12 oz of beer, 5 oz of wine, or 1 oz of hard liquor.  Work with your health care provider to maintain a healthy body weight or to lose weight. Ask what an ideal weight is for you.  Get at least 30 minutes of exercise that causes your heart to beat faster (aerobic exercise) most days of the week. Activities may include walking, swimming, or biking.  Work with your health  care provider or diet and nutrition specialist (dietitian) to adjust your eating plan to your individual calorie needs. Reading food labels   Check food labels for the amount of sodium per serving. Choose foods with less than 5 percent of the Daily Value of sodium. Generally, foods with less than 300 mg of sodium per serving fit into this eating plan.  To find whole grains, look for the word "whole" as the first word in the ingredient list. Shopping  Buy products labeled as "low-sodium" or "no salt added."  Buy fresh foods. Avoid canned foods and premade or frozen meals. Cooking  Avoid adding salt when cooking. Use salt-free seasonings or herbs instead of table salt or sea salt. Check with your health care provider or pharmacist before using salt substitutes.  Do not fry foods. Cook foods using healthy methods such as baking, boiling, grilling, and broiling instead.  Cook with heart-healthy oils, such as olive, canola, soybean, or sunflower oil. Meal planning  Eat a balanced diet that includes: ? 5 or more servings of fruits and vegetables each day. At each meal, try to fill half of your plate with fruits and vegetables. ? Up to 6-8 servings of whole grains each day. ? Less than 6 oz of lean meat, poultry, or fish each day. A 3-oz serving of meat is about the same size as a deck of cards. One egg equals 1 oz. ? 2 servings of  low-fat dairy each day. ? A serving of nuts, seeds, or beans 5 times each week. ? Heart-healthy fats. Healthy fats called Omega-3 fatty acids are found in foods such as flaxseeds and coldwater fish, like sardines, salmon, and mackerel.  Limit how much you eat of the following: ? Canned or prepackaged foods. ? Food that is high in trans fat, such as fried foods. ? Food that is high in saturated fat, such as fatty meat. ? Sweets, desserts, sugary drinks, and other foods with added sugar. ? Full-fat dairy products.  Do not salt foods before eating.  Try to eat  at least 2 vegetarian meals each week.  Eat more home-cooked food and less restaurant, buffet, and fast food.  When eating at a restaurant, ask that your food be prepared with less salt or no salt, if possible. What foods are recommended? The items listed may not be a complete list. Talk with your dietitian about what dietary choices are best for you. Grains Whole-grain or whole-wheat bread. Whole-grain or whole-wheat pasta. Brown rice. [place]. Bulgur. Whole-grain and low-sodium cereals. Pita bread. Low-fat, low-sodium crackers. Whole-wheat flour tortillas. Vegetables Fresh or frozen vegetables (raw, steamed, roasted, or grilled). Low-sodium or reduced-sodium tomato and vegetable juice. Low-sodium or reduced-sodium tomato sauce and tomato paste. Low-sodium or reduced-sodium canned vegetables. Fruits All fresh, dried, or frozen fruit. Canned fruit in natural juice (without added sugar). Meat and other protein foods Skinless chicken or [place]. Ground chicken or [place]. Pork with fat trimmed off. Fish and seafood. Egg whites. Dried beans, peas, or lentils. Unsalted nuts, nut butters, and seeds. Unsalted canned beans. Lean cuts of beef with fat trimmed off. Low-sodium, lean deli meat. Dairy Low-fat (1%) or fat-free (skim) milk. Fat-free, low-fat, or reduced-fat cheeses. Nonfat, low-sodium ricotta or cottage cheese. Low-fat or nonfat yogurt. Low-fat, low-sodium cheese. Fats and oils Soft margarine without trans fats. Vegetable oil. Low-fat, reduced-fat, or light mayonnaise and salad dressings (reduced-sodium). Canola, safflower, olive, soybean, and sunflower oils. Avocado. Seasoning and other foods Herbs. Spices. Seasoning mixes without salt. Unsalted popcorn and pretzels. Fat-free sweets. What foods are not recommended? The items listed may not be a complete list. Talk with your dietitian about what dietary choices are best for you. Grains Baked goods made with fat, such as croissants,  muffins, or some breads. Dry pasta or rice meal packs. Vegetables Creamed or fried vegetables. Vegetables in a cheese sauce. Regular canned vegetables (not low-sodium or reduced-sodium). Regular canned tomato sauce and paste (not low-sodium or reduced-sodium). Regular tomato and vegetable juice (not low-sodium or reduced-sodium). Angie Fava. Olives. Fruits Canned fruit in a light or heavy syrup. Fried fruit. Fruit in cream or butter sauce. Meat and other protein foods Fatty cuts of meat. Ribs. Fried meat. Berniece Salines. Sausage. Bologna and other processed lunch meats. Salami. Fatback. Hotdogs. Bratwurst. Salted nuts and seeds. Canned beans with added salt. Canned or smoked fish. Whole eggs or egg yolks. Chicken or [place] with skin. Dairy Whole or 2% milk, cream, and half-and-half. Whole or full-fat cream cheese. Whole-fat or sweetened yogurt. Full-fat cheese. Nondairy creamers. Whipped toppings. Processed cheese and cheese spreads. Fats and oils Butter. Stick margarine. Lard. Shortening. Ghee. Bacon fat. Tropical oils, such as coconut, palm kernel, or palm oil. Seasoning and other foods Salted popcorn and pretzels. Onion salt, garlic salt, seasoned salt, table salt, and sea salt. Worcestershire sauce. Tartar sauce. Barbecue sauce. Teriyaki sauce. Soy sauce, including reduced-sodium. Steak sauce. Canned and packaged gravies. Fish sauce. Oyster sauce. Cocktail sauce. Horseradish that you  find on the shelf. Ketchup. Mustard. Meat flavorings and tenderizers. Bouillon cubes. Hot sauce and Tabasco sauce. Premade or packaged marinades. Premade or packaged taco seasonings. Relishes. Regular salad dressings. Where to find more information:  National Heart, Lung, and [place]: https://wilson-eaton.com/  American Heart Association: www.heart.org Summary  The DASH eating plan is a healthy eating plan that has been shown to reduce high blood pressure (hypertension). It may also reduce your risk for type 2 diabetes,  heart disease, and stroke.  With the DASH eating plan, you should limit salt (sodium) intake to 2,300 mg a day. If you have hypertension, you may need to reduce your sodium intake to 1,500 mg a day.  When on the DASH eating plan, aim to eat more fresh fruits and vegetables, whole grains, lean proteins, low-fat dairy, and heart-healthy fats.  Work with your health care provider or diet and nutrition specialist (dietitian) to adjust your eating plan to your individual calorie needs. This information is not intended to replace advice given to you by your health care provider. Make sure you discuss any questions you have with your health care provider. Document Released: 03/26/2011 Document Revised: 03/19/2017 Document Reviewed: 03/30/2016 Elsevier Patient Education  2020 [employer].

## 2020-03-20 ENCOUNTER — Telehealth: Payer: Self-pay | Admitting: Cardiovascular Disease

## 2020-03-20 NOTE — Telephone Encounter (Signed)
[facility] and [facility] with the nurse for Dr. Soledad Gerlach.  She confirmed that Dr. Nyoka Cowden saw Russell Prince on 11/29 after I saw him.  She started him on chlorthalidone, which is an acceptable alternative for hydrochlorothiazide.  She is going to call Mr. Wiegman now to determine whether he will continue following up with [place] or with the [facility] for his work-up of secondary hypertension causes and for further management of his hypertension.  [place]ville Whitefield C. Oval Linsey, MD, [facility] 03/20/2020 8:42 AM

## 2020-03-22 ENCOUNTER — Telehealth: Payer: Self-pay

## 2020-03-22 DIAGNOSIS — Z0000 Encounter for general adult medical examination without abnormal findings: Secondary | ICD-10-CM

## 2020-03-22 NOTE — Telephone Encounter (Signed)
Called patient to discuss health coaching for stress management. Patient wants to participate in an initial health coaching session to determine if this is a program they want to participate in. Patient is scheduled for an appointment on 03/25/20 at 9:45am.

## 2020-03-22 NOTE — Telephone Encounter (Signed)
Call placed to pt reference PREP referral Explained program to pt, frequency of class Concerned about cost. Explained [facility] would cover cost. Pt concerned that he would still be billed for it.  Address noted to be [place]. Given the address to [facility] so he could determine if distance would be a barrier.  He is a maybe. Advised would call him back before classes starting in Jan to see if he has changed his mind.  Given number for contact.

## 2020-03-25 ENCOUNTER — Other Ambulatory Visit: Payer: Self-pay

## 2020-03-25 ENCOUNTER — Ambulatory Visit (INDEPENDENT_AMBULATORY_CARE_PROVIDER_SITE_OTHER): Payer: Self-pay

## 2020-03-25 DIAGNOSIS — Z0000 Encounter for general adult medical examination without abnormal findings: Secondary | ICD-10-CM

## 2020-03-25 NOTE — Patient Instructions (Signed)
Managing Stress, Adult Feeling a certain amount of stress is normal. Stress helps our body and mind get ready to deal with the demands of life. Stress hormones can motivate you to do well at work and meet your responsibilities. However severe or long-lasting (chronic) stress can affect your mental and physical health. Chronic stress puts you at higher risk for anxiety, depression, and other health problems like digestive problems, muscle aches, heart disease, high blood pressure, and stroke. What are the causes? Common causes of stress include:  Demands from work, such as deadlines, feeling overworked, or having long hours.  Pressures at home, such as money issues, disagreements with a spouse, or parenting issues.  Pressures from major life changes, such as divorce, moving, loss of a loved one, or chronic illness. You may be at higher risk for stress-related problems if you do not get enough sleep, are in poor health, do not have emotional support, or have a mental health disorder like anxiety or depression. How to recognize stress Stress can make you:  Have trouble sleeping.  Feel sad, anxious, irritable, or overwhelmed.  Lose your appetite.  Overeat or want to eat unhealthy foods.  Want to use drugs or alcohol. Stress can also cause physical symptoms, such as:  Sore, tense muscles, especially in the shoulders and neck.  Headaches.  Trouble breathing.  A faster heart rate.  Stomach pain, nausea, or vomiting.  Diarrhea or constipation.  Trouble concentrating. Follow these instructions at home: Lifestyle  Identify the source of your stress and your reaction to it. See a therapist who can help you change your reactions.  When there are stressful events: ? Talk about it with family, friends, or co-workers. ? Try to think realistically about stressful events and not ignore them or overreact. ? Try to find the positives in a stressful situation and not focus on the  negatives. ? Cut back on responsibilities at work and home, if possible. Ask for help from friends or family members if you need it.  Find ways to cope with stress, such as: ? Meditation. ? Deep breathing. ? Yoga or tai chi. ? Progressive muscle relaxation. ? Doing art, playing music, or reading. ? Making time for fun activities. ? Spending time with family and friends.  Get support from family, friends, or spiritual resources. Eating and drinking  Eat a healthy diet. This includes: ? Eating foods that are high in fiber, such as beans, whole grains, and fresh fruits and vegetables. ? Limiting foods that are high in fat and processed sugars, such as fried and sweet foods.  Do not skip meals or overeat.  Drink enough fluid to keep your urine pale yellow. Alcohol use  Do not drink alcohol if: ? Your health care provider tells you not to drink. ? You are pregnant, may be pregnant, or are planning to become pregnant.  Drinking alcohol is a way some people try to ease their stress. This can be dangerous, so if you drink alcohol: ? Limit how much you use to:  0-1 drink a day for women.  0-2 drinks a day for men. ? Be aware of how much alcohol is in your drink. In the [place] one drink equals one 12 oz bottle of beer (355 mL), one 5 oz glass of wine (148 mL), or one 1 oz glass of hard liquor (44 mL). Activity   Include 30 minutes of exercise in your daily schedule. Exercise is a good stress reducer.  Include time in your day   for an activity that you find relaxing. Try taking a walk, going on a bike ride, reading a book, or listening to music.  Schedule your time in a way that lowers stress, and keep a consistent schedule. Prioritize what is most important to get done. General instructions  Get enough sleep. Try to go to sleep and get up at about the same time every day.  Take over-the-counter and prescription medicines only as told by your health care provider.  Do not use any  products that contain nicotine or tobacco, such as cigarettes, e-cigarettes, and chewing tobacco. If you need help quitting, ask your health care provider.  Do not use drugs or smoke to cope with stress.  Keep all follow-up visits as told by your health care provider. This is important. Where to find support  Talk with your health care provider about stress management or finding a support group.  Find a therapist to work with you on your stress management techniques. Contact a health care provider if:  Your stress symptoms get worse.  You are unable to manage your stress at home.  You are struggling to stop using drugs or alcohol. Get help right away if:  You may be a danger to yourself or others.  You have any thoughts of death or suicide. If you ever feel like you may hurt yourself or others, or have thoughts about taking your own life, get help right away. You can go to your nearest emergency department or call:  Your local emergency services (911 in the [place]  A suicide crisis helpline, such as the [place] at 786-499-0900. This is open 24 hours a day. Summary  Feeling a certain amount of stress is normal, but severe or long-lasting (chronic) stress can affect your mental and physical health.  Chronic stress can put you at higher risk for anxiety, depression, and other health problems like digestive problems, muscle aches, heart disease, high blood pressure, and stroke.  You may be at higher risk for stress-related problems if you do not get enough sleep, are in poor health, lack emotional support, or have a mental health disorder like anxiety or depression.  Identify the source of your stress and your reaction to it. Try talking about stressful events with family, friends, or co-workers, finding a coping method, or getting support from spiritual resources.  If you need more help, talk with your health care provider about finding a support group  or a mental health therapist. This information is not intended to replace advice given to you by your health care provider. Make sure you discuss any questions you have with your health care provider. Document Revised: 11/02/2018 Document Reviewed: 11/02/2018 Elsevier Patient Education  [place].

## 2020-03-25 NOTE — Progress Notes (Signed)
Russell Prince   Appointment Outcome:  Completed, Session #: Initial Health Coaching Session  AGREEMENTS SECTION   Overall Goal(s): Stress management                                              Agreement/Action Steps:  Exercise 2x/week in gym or PREP program Engage in creative writing daily 29mins - 1hour daily  Continue positive self-talk daily Maintain healthy boundaries Prayer and meditation daily  Self-check-ins during quiet time daily  Yard work   Progress Notes:  Russell Prince presented today to discuss health coaching for stress management. The patient describes himself as creative and a quick processor of information. He is into making various styled t-shirts, yard work (e.g., Management consultant), and a hands-on person in general. Patient stated that he is the most creative in his darkest moments.   Patient stated that he internalizes stress and either must get away from the situation to process it or do something physical. Patient takes time to spend quietly by himself when stressed or plays with his puppy. Patient states that he has been practicing being his authentic self during stress.   Patient expressed that he is a man of faith and that he constantly uses positive self-talk when stressed or when dealing with others in stressful encounters. Patient stated that he is concerned about a few family relationships that have fell off after setting healthy boundaries between them. Patient would love the dynamics to change among themselves.  Patient did mention that he is still dealing with the loss of his mother from three years ago. Patient stated that he copes with this by remembering positive things about her or dedicating the day to her. Patient does not see this as a source of stress because of how he processes the situation.  Patient is concerned about his relationships with other family members after establishing healthy boundaries. Patient is choosing not to make  contact to avoid confrontation.  Patient has an older friend that he reaches out to that he will enlist as a supporter.   Patient is frustrated with the processing of his medications by the [facility] because he always run out before he receives the new prescription even when he calls 2 weeks in advance for a refill.   . Indicators of Success and Accountability:  To be able to take moment by moment and being present to reduce internalizing stress. . Readiness: Patient is in the preparation phase of stress management . Strengths and Supports: Patient has an older friend that encourages him that he will rely on for support. Patient relies heavily on his faith. Patient has been through a program, "Thinking for a Change," that he has been able to draw some strategies from that will help him think creatively about managing stress. Patient is also crafty and a hands-on person that will help him engage in various activities for stress management.  . Challenges and Barriers: Social interactions  Coaching Outcomes: Patient stated that it's best that he Care Guide discussed self-check-ins with the patient to determine if this was a step that he could add to his quiet time or periodically throughout the day. Patient is checking in with himself in a way that incorporates his faith, and he plans on continuing to do so. Patient was informed that he could add to this check-in by asking himself how he is feeling  during stressful moments to remain present and to determine what he needs in these moments to diffuse his stress. Patient stated that he could do that.  Patient discussed exercising 2x/week for stress management. Care Guide mentioned the PREP Program to the patient as an option or working out at home. Patient is interested in starting the PREP program for exercise to help with stress management.   Patient is taking the perspective that it will take patience to deal with stress and that he needs to take time to get a  deeper insight of the situation. Patient still opts to avoid or separate himself from stressors if possible. Patient believes that his quiet time has been beneficial in him processing his thoughts and feelings at times.   Patient stated that he will continue to engage in his creative activities such as making t-shirts and planting flowers to help him channel his energy into something positive. Patient will add creative writing for 30 minutes to 1 hour per day to help him process his thoughts and emotions while channeling his negative energy into producing art.  Patient already has a support system in place that he will utilize more.   Patient stated that he is making these changes because he wants to keep himself from dying because he is aware that hypertension is a silent killer.   Patient major concern is that he has the knowledge and tools to manage stress, it's just a matter of applying the principles.   Patient has received a signed copy of the Health Coaching agreement along with the Code of Ethics.   Note: Check on medications for patient - delays in refills, Will be out of Atenolol in a few days. Patient is interested in participating in the PREP program. [place] will follow up with Winifred [facility] about enrollment.

## 2020-04-02 ENCOUNTER — Telehealth: Payer: Self-pay

## 2020-04-02 DIAGNOSIS — Z0000 Encounter for general adult medical examination without abnormal findings: Secondary | ICD-10-CM

## 2020-04-02 NOTE — Telephone Encounter (Signed)
Called patient with representative, Beckie Busing, from the [facility]. Care Guide aided patient in getting his refills and understanding the process of getting his mail order of his medications before he runs out. Patient stated her understands the process of ordering his medications from the [facility].

## 2020-04-03 ENCOUNTER — Telehealth: Payer: Self-pay

## 2020-04-03 DIAGNOSIS — Z0000 Encounter for general adult medical examination without abnormal findings: Secondary | ICD-10-CM

## 2020-04-03 NOTE — Telephone Encounter (Signed)
Returned patient's call to discuss health coaching. Patient inquired about his wife attending his health coaching session on 12/20. Care Guide informed the patient that she is able to accompany him to his next session.

## 2020-04-08 ENCOUNTER — Ambulatory Visit (INDEPENDENT_AMBULATORY_CARE_PROVIDER_SITE_OTHER): Payer: Non-veteran care

## 2020-04-08 ENCOUNTER — Other Ambulatory Visit: Payer: Self-pay

## 2020-04-08 DIAGNOSIS — Z0000 Encounter for general adult medical examination without abnormal findings: Secondary | ICD-10-CM

## 2020-04-08 NOTE — Progress Notes (Signed)
Appointment Outcome:  Completed, Session #: 1  AGREEMENTS SECTION    Overall Goal(s): Stress management                                            Agreement/Action Steps:  Exercise 2x/wk in gym or PREP program Engage in creative writing daily 30 mins - 1 hour daily Positive self-talk throughout the day - self check ins during quiet time daily Maintain healthy boundaries Prayer/meditation    Progress Notes:  Patient arrived today with his wife, Angie. She joined today's session to learn how she can best support her husband by listening to his concerns and the steps that he is working through.  Patient reported that he has been incorporating personal time to reflect on his thoughts and things that he needs to address. During this time, he finds himself also praying and meditating as ways to reduce his stress. Patient has been engaging in creative art with making t-shirts as a hobby. Patient mentioned that he has been writing some and it's been good because he gets to write down things regarding what brings about stress for him and gives him additional time reflect on his thoughts and feelings.  Patient has been able to maintain healthy boundaries and was able to spend some time with his granddaughter. Patient continues to practice removing himself from stressful situations/conversations. Patient is practicing positive communication with others   Patient stated that his wife is his biggest supporter. Patient mentioned that she is a diffuser for some of his stressful moments because she encourages him to see things from a positive perspective. Patient also finds it beneficial to talk through things with this older friend.  Patient finds himself using self-check-ins and positive self-talk during these moments a lot to steer his thoughts and emotions in a positive direction.   The patient also mentioned that sometimes when he is stressed, he tends to shut down depending on the situation. This  is a way that he tries to contain himself.    Patient feels that these steps have been helping in keeping him calmer. Patient wants to get a handle on over thinking and over processing things that tends to cause him stress. The patient stated that when his plate is full, he needs to empty his head.   Patient also has found that having compassion for and helping others is an outlet for his stress.   Patient is waiting on the opportunity to join the PREP program at the beginning of the year to start exercising under supervision.    Patient's session also included various concerns regarding care/medications, and coverage through the [place] and how it is impacting him receiving services at [facility].  . Indicators of Success and Accountability:  Being able to process stressful situations in the moment by checking in with self. . Readiness: Patient is in the action phase of stress management. . Strengths and Supports: Patient has his wife and older friend as supporters. 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 . Challenges and Barriers: Over thinking and over analyzing things are his challenges to managing stress.   Coaching Outcomes: Patient feels that the current steps that he is working on towards stress management has helped him in the past two weeks. Patient finds check-ins and reflections with positive self-talk the most effective, followed by  having his support system. Patient will continue to work on same action steps as outlined previously.  Patient will continue to schedule personal time alone but will incorporate his puppy as a stress reliever during some of these scheduled times.   Care Guide will reach out to his Cardiologist to inquire about the patient's concerns so they can reach out to him.    Attempted: Marland Kitchen Fulfilled #- Patient has been incorporating positive self-talk, self-reflections, and check-ins daily. Patient is  maintaining healthy boundaries and utilizing his support system. . Partial #- Patient has been able to engage in creative writing some days.  . Not met #- client did not attempt the agreement and did not express concern . You can also provide a summary or 'cut and paste' the agreements here, if not noted elsewhere   Not attempted: Marland Kitchen Deferred #- Patient will begin the PREP program at the beginning of the year. Will follow up with Winifred Olive in January 2022.

## 2020-04-10 ENCOUNTER — Encounter: Payer: Self-pay | Admitting: *Deleted

## 2020-04-10 ENCOUNTER — Telehealth: Payer: Self-pay

## 2020-04-10 NOTE — Telephone Encounter (Signed)
Tried to call patient regarding message, left message to call back   Hi team,   This patient is having difficulty understanding why he was informed that he should not take Atenolol. He stated that Ms. Nyoka Cowden is afraid that it was dropping his heart rate. Is this a Rx that was refilled from here? I was able to help him get it filled through the [place] but now they are telling him not to take it.   Dr. Oval Linsey, there still seems to be some confusion around him receiving treatment from our office verses the [facility] for the ultrasound of his kidneys he claims that you ordered for him. He has tried to contact Ms. Green to discuss this, but has not been successful. I do not know how to advise him on this. Can you assist me?   Best regards,  Amy

## 2020-04-10 NOTE — Telephone Encounter (Signed)
-----   Message from Avelino Leeds sent at 04/08/2020  2:59 PM EST ----- Regarding: Atenolol Hi team,  This patient is having difficulty understanding why he was informed that he should not take Atenolol. He stated that Ms. Nyoka Cowden is afraid that it was dropping his heart rate. Is this a Rx that was refilled from here? I was able to help him get it filled through the [place] but now they are telling him not to take it.   Dr. Oval Linsey, there still seems to be some confusion around him receiving treatment from our office verses the [facility] for the ultrasound of his kidneys he claims that you ordered for him. He has tried to contact Ms. Green to discuss this, but has not been successful. I do not know how to advise him on this. Can you assist me?  Best regards, Amy

## 2020-04-10 NOTE — Telephone Encounter (Signed)
This encounter was created in error - please disregard.

## 2020-04-11 NOTE — Telephone Encounter (Signed)
Late entry Call placed to patient reference referral to PREP and class starting on 04/23/20 at [facility] from 1pm-215pm every T/TH x 12 wks Agreeable to starting then Distance may be an issue Texted address of [place] so he could GPS the location Scheduled intake for 12/30 at 230pm

## 2020-04-17 ENCOUNTER — Other Ambulatory Visit: Payer: Self-pay

## 2020-04-17 ENCOUNTER — Telehealth: Payer: Self-pay

## 2020-04-17 ENCOUNTER — Ambulatory Visit: Payer: Self-pay

## 2020-04-17 NOTE — Progress Notes (Unsigned)
     04/17/2020 Brit Wernette January 08, 1956 [id number]   HPI:  Russell Prince is a 64 y.o. male patient of Dr Duke Salvia, who presents today for advanced hypertension clinic follow up.  In addition to hypertension, his medical history is significant for hyperlipidemia, GERD and recent COVID 19 pneumonia.  He receives most of his medical care from the [place] in [place].       Blood Pressure Goal:  130/80  Current Medications: amlodipine 10 mg qd, atenolol 100 mg qd, enalapril 20 mg qd, hctz 25 mg qd  Family Hx:  Social Hx:  Diet:  Exercise:  Home BP readings:  Intolerances:   Labs:  Wt Readings from Last 3 Encounters:  03/18/20 213 lb (96.6 kg)  05/01/19 194 lb 12.4 oz (88.3 kg)  05/02/18 215 lb (97.5 kg)   BP Readings from Last 3 Encounters:  03/18/20 (!) 162/100  03/04/20 (!) 164/100  05/04/19 136/87   Pulse Readings from Last 3 Encounters:  03/18/20 (!) 56  03/04/20 (!) 52  05/04/19 (!) 58    Current Outpatient Medications  Medication Sig Dispense Refill  . amLODipine (NORVASC) 10 MG tablet Take 1 tablet (10 mg total) by mouth daily. 30 tablet 0  . aspirin EC 81 MG tablet Take 81 mg by mouth daily.    Marland Kitchen atenolol (TENORMIN) 100 MG tablet Take 100 mg by mouth daily.    . enalapril (VASOTEC) 20 MG tablet Take 20 mg by mouth daily.    . hydrochlorothiazide (HYDRODIURIL) 25 MG tablet Take 1 tablet (25 mg total) by mouth daily. 90 tablet 3  . pantoprazole (PROTONIX) 40 MG tablet Take 40 mg by mouth 2 (two) times daily before a meal.    . sildenafil (VIAGRA) 50 MG tablet Take 25 mg by mouth daily as needed for erectile dysfunction. (Patient not taking: Reported on 03/18/2020)     No current facility-administered medications for this visit.    Allergies  Allergen Reactions  . Decadron [Dexamethasone] Itching and Other (See Comments)    Pt states he gets a feeling of heat and itching in groin area and head (with IVP)    Past Medical History:  Diagnosis Date  .  Anemia   . Fatty liver   . GERD (gastroesophageal reflux disease)   . HLD (hyperlipidemia)   . HTN (hypertension)   . Obesity (BMI 30.0-34.9) 03/18/2020  . 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  No problem-specific Assessment & Plan notes found for this encounter.   Phillips Hay PharmD CPP [facility] [address] [place], [place] [address] [phone]

## 2020-04-17 NOTE — Telephone Encounter (Signed)
° ° °  Pt would like to speak with Nurse Amy. He said he have some questions

## 2020-04-18 ENCOUNTER — Telehealth: Payer: Self-pay

## 2020-04-18 NOTE — Telephone Encounter (Signed)
Late entry: called patient to yesterday 04/17/20 to inform will need to reschedule intake for PREP class.

## 2020-04-22 ENCOUNTER — Other Ambulatory Visit: Payer: Self-pay

## 2020-04-22 ENCOUNTER — Ambulatory Visit (INDEPENDENT_AMBULATORY_CARE_PROVIDER_SITE_OTHER): Payer: Self-pay

## 2020-04-22 ENCOUNTER — Telehealth: Payer: Self-pay

## 2020-04-22 DIAGNOSIS — Z0000 Encounter for general adult medical examination without abnormal findings: Secondary | ICD-10-CM

## 2020-04-22 NOTE — Progress Notes (Signed)
Appointment Outcome:  Completed, Session #: 2  AGREEMENTS SECTION   Overall Goal(s): Stress management                                               Agreement/Action Steps:  Engage in creative writing daily 30 mins - 1 hr daily Positive self-talk throughout the day Self check-ins during quiet time daily Maintain healthy boundaries Prayer/meditation Utilize support system Prep Program or Exercise at home  Progress Notes:  Patient has been engaging in creative writing as he reflects on his behavior and is an outlet for his emotions and thoughts. Patient stated that he frequently check-in with self throughout the day. Patient engages in prayer and meditation during his check-ins during his quiet time daily.  Patient is able to recognize triggers based on the situation. Patient shared that in the moment sometimes, his warning signs that he is about to experience negative emotions/stress things may not be clear and he have to grapple with situations, thoughts, and emotions when they arise. Patient mentioned that sometimes it does create anxiety where it causes tension in the body or blood pressure to raise as he mentally struggles with some issues. Patient practices positive self-talk in these moments as well to redirect his thoughts and emotions to avoid reacting in a negative way.  Patient reported that in addition to the steps that's outlined, he takes a step back when this occurs and take deep breathes, redirect conversations that are stressful, identify feelings, and ask self various questions about situation. Patient stated these approaches helps him to process stress from a different perspective.   Patient stated that he has utilized his support system occasionally. One of his support people are currently sick and is unable to be a support now. Patient's wife is a support person he utilizes sometimes.   Patient was informed that the PREP program has been cancelled due to the rise in  Covid-19 cases. Patient has not engaged in exercise at home.    . Indicators of Success and Accountability:  Patient has been able to process his stress from a different perspective and avoid reacting in a negative manner.  . Readiness: Patient is in action phase of stress management. . Strengths and Supports: Patient has his older friend and wife as supporters. Patient is creative and compassionate, which he can draw from to manage stress.  . Challenges and Barriers: Stressful interactions with others  Coaching Outcomes: Patient will begin to exercise at home but have not decided on a routine or time limit yet.   Patient will resume engaging with his older friend as a support after he recovers. Patient will continue to incorporate his wife when needed as a support/encourager.   Patient will engage in the remaining action steps as outlined in addition to taking deep breathes in the moments of anxiety and tension. Patient will follow the steps from Thinking for a Change to process stress as well.   Stop and Think - processing emotions  Problem Description - Identify the problem Get Information to Set a Goal - What do you want to accomplish Choices and Consequences - Understand actions and outcomes Choose, Plan, and Evaluate - Make a choice, how will you carry it out? - Did it work?   Attempted: Marland Kitchen Fulfilled #- Patient is checking in with self/reflections, utilizing positive self-talk, maintaining healthy boundaries, and engaging  in prayer/meditation daily. . Partial #- Patient is engaging in creative writing most days. Patient is interacting with their support system occasionally. . Not met #- The PREP class has been cancelled and the patient was not able to participate. Patient did not engage in exercise at home.

## 2020-04-22 NOTE — Telephone Encounter (Signed)
Called patient to discuss health coaching agreement before appointment because patient is concerned about being charged for services. Left patient a message to return call prior to 10:30am appointment today.

## 2020-04-22 NOTE — Telephone Encounter (Signed)
I called the patient three times with no answer or return call.

## 2020-05-02 ENCOUNTER — Telehealth: Payer: Self-pay

## 2020-05-02 DIAGNOSIS — Z0000 Encounter for general adult medical examination without abnormal findings: Secondary | ICD-10-CM

## 2020-05-02 NOTE — Telephone Encounter (Signed)
Called the patient to inform him that his session scheduled on 05/07/20 would have to be held over the phone and gave him the option to reschedule if he prefer. Patient would rather come in and was rescheduled for 05/09/20 at 10:30am.

## 2020-05-06 ENCOUNTER — Ambulatory Visit: Payer: Self-pay

## 2020-05-07 ENCOUNTER — Ambulatory Visit: Payer: Self-pay

## 2020-05-09 ENCOUNTER — Other Ambulatory Visit: Payer: Self-pay

## 2020-05-09 ENCOUNTER — Telehealth: Payer: Self-pay | Admitting: Licensed Clinical Social Worker

## 2020-05-09 ENCOUNTER — Ambulatory Visit (INDEPENDENT_AMBULATORY_CARE_PROVIDER_SITE_OTHER): Payer: No Typology Code available for payment source

## 2020-05-09 ENCOUNTER — Telehealth: Payer: Self-pay

## 2020-05-09 DIAGNOSIS — Z0000 Encounter for general adult medical examination without abnormal findings: Secondary | ICD-10-CM

## 2020-05-09 NOTE — Telephone Encounter (Signed)
Called patient to discuss the termination of our health coaching relationship per incident in clinic today with his wife showing up. Wife accused husband of having an unhealthy attachment to the Care Guide. Due to safety reasons and to maintain professional conduct, the Care Guide and Supervisor is in agreement of the termination.  Patient is aware of circumstances and appreciate the transparency and the help that he did receive. Patient would like a call from Supervisor to clear up any loose ends. Care Guide has informed Supervisor of request.

## 2020-05-09 NOTE — Telephone Encounter (Signed)
Patients wife calling to speak with the social worker she spoke with today.

## 2020-05-09 NOTE — Progress Notes (Signed)
Appointment Outcome:  Completed, Session #: 3  AGREEMENTS SECTION    Overall Goal(s): Stress management                                               Agreement/Action Steps:  Utilize support system Engage in creative writing 30 mins - 1 hr daily Positive self-talk/encouragement throughout the day Self-check-ins during quiet time daily Maintain healthy boundaries Prayer/meditation Thinking for a Change worksheets   Progress Notes:  Patient completed some of the Thinking for a Change worksheets. These worksheets offer insight into what the patient is feeling and currently doing to process his emotions and manage stress. Patient expressed his concerns regarding healthy boundaries in his relationships. Patient feels that various conversations are stressful at times, and it causes him to feel physically drained and worthless. Patient is concerned about letting himself and others down. Patient identified ways that he would be able to do to avoid confrontation or being overwhelmed by others' actions. Patient discussed calming strategies such as ending a call and calming down in conversations. Patient identified that his warning sign to not managing stress well is not thinking things through and reacting fast. At other times, he shut down. Patient have other concerns dealing with family that he is trying to process that is causing him stress. Patient tends to think the worst.  Patient is writing but not daily.   Patient has continued to do self-check-ins during quiet time, prayer/meditation, and positive self-talk. Patient stated that these steps work for him and shared a written report of his decrease in blood pressure over the past two weeks. Patient stated that he has utilized his support system, but he stressed the need of having someone that will listen and offer sound advice.   . Indicators of Success and Accountability:  Patient can focus on self to manage his reactions to stress.   . Readiness: Patient is in the action phase of stress management. . Strengths and Supports: Patient has an older friend that he engages with for support occasionally. 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 . Challenges and Barriers: Unhealthy boundaries with family members.   Coaching Outcomes: Patient recognizes that he has implemented extra action steps to manage stress when interacting with others. He has taken time to walk his dog and get out of the house, avoid interrupting others when talking, using quiet time to reflect on his thoughts and actions and to help himself sort through things so he doesn't over think. Patient also recognized that watching movies he like also helps him relieve stress.  Patient mentioned that the steps already in place helps him when he applies them and will continue to implement them towards stress management. Patient is in search of someone that he can talk to in confidence as a support person.   Patient session was ended after Care Guide was pulled from session.  Attempted: Marland Kitchen Fulfilled - Patient is incorporating positive self-talk/encouragement throughout the day, self-check-ins, and prayer/mediation. . Partial - Patient is writing some days and utilize his support system occasionally. Patient completed some of the Thinking for a Change worksheets. Patient has attempted to maintain boundaries.

## 2020-05-09 NOTE — Telephone Encounter (Signed)
CSW was approached by clinic staff. Pt wife is in lobby requesting to speak with staff. CSW met with pt wife, pt wife shared concerns with home behaviors and safety. No pt information provided to pt wife as no DPR on file. CSW provided pt wife with [facility] resources, she states no immediate concerns with safety today but will follow up with those resources moving forward as needed. Information provided and encounter also reviewed w/ Maudie Mercury, clinic supervisor and Kennyth Lose, [place] team lead.   Westley Hummer, MSW, [place]  [phone]

## 2020-05-10 ENCOUNTER — Telehealth: Payer: Self-pay | Admitting: Licensed Clinical Social Worker

## 2020-05-10 NOTE — Telephone Encounter (Signed)
CSW received request from Russell Prince that patient is requesting call. CSW contacted patient who reviewed events of yesterday in the office and he apologized for the confusion. Patient shared he is very grateful for the support and services provided by Russell Prince and wanted to share with CSW. "She was able to help me with my triggers to keep my BP in control" CSW offered assistance with community referrals for counseling if needed and he denies any further needs. CSW available for any additional needs. Russell Prince, [place], Russell Prince

## 2020-05-23 ENCOUNTER — Ambulatory Visit: Payer: No Typology Code available for payment source

## 2020-10-09 ENCOUNTER — Encounter (HOSPITAL_COMMUNITY): Payer: Self-pay | Admitting: *Deleted

## 2020-10-09 ENCOUNTER — Other Ambulatory Visit: Payer: Self-pay

## 2020-10-09 ENCOUNTER — Emergency Department (HOSPITAL_COMMUNITY)
Admission: EM | Admit: 2020-10-09 | Discharge: 2020-10-09 | Disposition: A | Discharge status: Home / Self Care | Payer: No Typology Code available for payment source | Attending: Emergency Medicine | Admitting: Emergency Medicine

## 2020-10-09 DIAGNOSIS — I10 Essential (primary) hypertension: Secondary | ICD-10-CM | POA: Insufficient documentation

## 2020-10-09 DIAGNOSIS — M62838 Other muscle spasm: Secondary | ICD-10-CM | POA: Diagnosis present

## 2020-10-09 DIAGNOSIS — D72829 Elevated white blood cell count, unspecified: Secondary | ICD-10-CM | POA: Insufficient documentation

## 2020-10-09 DIAGNOSIS — G253 Myoclonus: Secondary | ICD-10-CM | POA: Diagnosis not present

## 2020-10-09 DIAGNOSIS — Z8616 Personal history of COVID-19: Secondary | ICD-10-CM | POA: Insufficient documentation

## 2020-10-09 DIAGNOSIS — N179 Acute kidney failure, unspecified: Secondary | ICD-10-CM | POA: Insufficient documentation

## 2020-10-09 DIAGNOSIS — R001 Bradycardia, unspecified: Secondary | ICD-10-CM | POA: Diagnosis not present

## 2020-10-09 DIAGNOSIS — Z7982 Long term (current) use of aspirin: Secondary | ICD-10-CM | POA: Insufficient documentation

## 2020-10-09 DIAGNOSIS — Z79899 Other long term (current) drug therapy: Secondary | ICD-10-CM | POA: Insufficient documentation

## 2020-10-09 LAB — URINALYSIS, ROUTINE W REFLEX MICROSCOPIC
Bilirubin Urine: NEGATIVE
Glucose, UA: NEGATIVE mg/dL
Hgb urine dipstick: NEGATIVE
Ketones, ur: NEGATIVE mg/dL
Leukocytes,Ua: NEGATIVE
Nitrite: NEGATIVE
Protein, ur: NEGATIVE mg/dL
Specific Gravity, Urine: 1.014 (ref 1.005–1.030)
pH: 5 (ref 5.0–8.0)

## 2020-10-09 LAB — COMPREHENSIVE METABOLIC PANEL
ALT: 23 U/L (ref 0–44)
AST: 22 U/L (ref 15–41)
Albumin: 4.3 g/dL (ref 3.5–5.0)
Alkaline Phosphatase: 84 U/L (ref 38–126)
Anion gap: 9 (ref 5–15)
BUN: 36 mg/dL — ABNORMAL HIGH (ref 8–23)
CO2: 28 mmol/L (ref 22–32)
Calcium: 9.6 mg/dL (ref 8.9–10.3)
Chloride: 97 mmol/L — ABNORMAL LOW (ref 98–111)
Creatinine, Ser: 1.7 mg/dL — ABNORMAL HIGH (ref 0.61–1.24)
GFR, Estimated: 44 mL/min — ABNORMAL LOW (ref >60)
Glucose, Bld: 123 mg/dL — ABNORMAL HIGH (ref 70–99)
Potassium: 4.7 mmol/L (ref 3.5–5.1)
Sodium: 134 mmol/L — ABNORMAL LOW (ref 135–145)
Total Bilirubin: 0.4 mg/dL (ref 0.3–1.2)
Total Protein: 8.2 g/dL — ABNORMAL HIGH (ref 6.5–8.1)

## 2020-10-09 LAB — CBC WITH DIFFERENTIAL/PLATELET
Abs Immature Granulocytes: 0.05 10*3/uL (ref 0.00–0.07)
Basophils Absolute: 0 10*3/uL (ref 0.0–0.1)
Basophils Relative: 0 %
Eosinophils Absolute: 0.1 10*3/uL (ref 0.0–0.5)
Eosinophils Relative: 1 %
HCT: 45.4 % (ref 39.0–52.0)
Hemoglobin: 15.1 g/dL (ref 13.0–17.0)
Immature Granulocytes: 0 %
Lymphocytes Relative: 27 %
Lymphs Abs: 3.2 10*3/uL (ref 0.7–4.0)
MCH: 29.2 pg (ref 26.0–34.0)
MCHC: 33.3 g/dL (ref 30.0–36.0)
MCV: 87.6 fL (ref 80.0–100.0)
Monocytes Absolute: 1 10*3/uL (ref 0.1–1.0)
Monocytes Relative: 9 %
Neutro Abs: 7.2 10*3/uL (ref 1.7–7.7)
Neutrophils Relative %: 63 %
Platelets: 259 10*3/uL (ref 150–400)
RBC: 5.18 MIL/uL (ref 4.22–5.81)
RDW: 13.6 % (ref 11.5–15.5)
WBC: 11.5 10*3/uL — ABNORMAL HIGH (ref 4.0–10.5)
nRBC: 0 % (ref 0.0–0.2)

## 2020-10-09 LAB — CK: Total CK: 447 U/L — ABNORMAL HIGH (ref 49–397)

## 2020-10-09 MED ORDER — SODIUM CHLORIDE 0.9 % IV BOLUS
1000.0000 mL | Freq: Once | INTRAVENOUS | Status: AC
  Administered 2020-10-09: 1000 mL via INTRAVENOUS

## 2020-10-09 NOTE — Discharge Instructions (Signed)
Please follow close with your primary care provider regarding your visit today. You will need your kidney function rechecked. You will also need close follow-up regarding your muscle spasms and twitching. Please stay hydrated.   If symptoms worsen in any way, please do not hesitate to return to the emergency department for reevaluation.

## 2020-10-09 NOTE — ED Provider Notes (Signed)
[facility] DEPT Provider Note   CSN: [id number] Arrival date & time: 10/09/20  1335     History Chief Complaint  Patient presents with   Generalized Body Aches    Russell Prince is a 65 y.o. male medical history of hypertension, GERD, presenting for evaluation of muscle spasms and twitching.  Symptoms began about 3 days ago.  He states the symptoms occur in his legs, arms, and yesterday began developing into his abdomen which caused him concern.  He had severe symptoms overnight.  He will have both twitching of his muscle and spasm/cramping that causes pain.  He was instructed to come to the ED by his [facility] doctor.  He denies any paresthesias, fever, chills, chest pain, shortness of breath, abdominal pain, diarrhea, constipation, vomiting, urinary symptoms.  No recent immobilization or strenuous physical activity.  No changes in urine.  No new medications.  Does not take any statins.  Has been trying to hydrate with water and Gatorade without relief.  The history is provided by the patient.      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   Obesity (BMI 30.0-34.9) 03/18/2020   Pneumonia due to COVID-19 virus 04/30/2019   Acute respiratory failure with hypoxia ([place]) 04/30/2019   BPH (benign prostatic hyperplasia) 04/30/2019   AKI (acute kidney injury) ([place]) 04/30/2019   Hypertensive urgency 04/30/2019    Past Surgical History:  Procedure Laterality Date   Tooth Implants          Family History  Problem Relation Age of Onset   Cancer Mother        unknown type   Hypertension Mother    Lung cancer Mother    Diabetes Sister    Asthma Sister    Hypertension Sister     Lung disease Sister    Colon cancer Maternal Uncle    Stroke Maternal Aunt    Stomach cancer Neg Hx    Esophageal cancer Neg Hx     Social History   Tobacco Use   Smoking status: Never   Smokeless tobacco: Never  Vaping Use   Vaping Use: Never used  Substance Use Topics   Alcohol use: Never   Drug use: Never    Home Medications Prior to Admission medications   Medication Sig Start Date End Date Taking? Authorizing Provider  amLODipine (NORVASC) 10 MG tablet Take 1 tablet (10 mg total) by mouth daily. 05/04/19 03/18/20  Elodia Florence., MD  aspirin EC 81 MG tablet Take 81 mg by mouth daily.    [provider]  atenolol (TENORMIN) 100 MG tablet Take 100 mg by mouth daily.    [provider]  enalapril (VASOTEC) 20 MG tablet Take 20 mg by mouth daily.    [provider]  hydrochlorothiazide (HYDRODIURIL) 25 MG tablet Take 1 tablet (25 mg total) by mouth daily. 03/18/20 06/16/20  Skeet Latch, MD  pantoprazole (PROTONIX) 40 MG tablet Take 40 mg by mouth 2 (two) times daily before a meal.    [provider]  sildenafil (VIAGRA) 50 MG tablet Take 25 mg by mouth daily as needed for erectile dysfunction. Patient not taking: Reported on 03/18/2020  [provider]    Allergies    Decadron [dexamethasone]  Review of Systems   Review of Systems  All other systems reviewed and are negative.  Physical Exam Updated Vital Signs BP (!) 132/91   Pulse (!) 44   Temp 98.4 F (36.9 C)   Resp (!) 21   SpO2 99%   Physical Exam Vitals and nursing note reviewed.  Constitutional:      General: He is not in acute distress.    Appearance: He is well-developed.  HENT:     Head: Normocephalic and atraumatic.  Eyes:     Conjunctiva/sclera: Conjunctivae normal.  Cardiovascular:     Rate and Rhythm: Regular rhythm. Bradycardia present.     Pulses: Normal pulses.  Pulmonary:     Effort: Pulmonary effort is normal.     Breath  sounds: Normal breath sounds.  Abdominal:     General: Bowel sounds are normal.     Palpations: Abdomen is soft.     Tenderness: There is no abdominal tenderness.  Musculoskeletal:     Comments: Small muscle to left lower leg is twitching on exam. Compartments are soft.  No skin changes.  No large tenderness to the muscle groups.  Skin:    General: Skin is warm.  Neurological:     Mental Status: He is alert.     Comments: Normal distal sensation to all extremities. No asterixis  Psychiatric:        Behavior: Behavior normal.    ED Results / Procedures / Treatments   Labs (all labs ordered are listed, but only abnormal results are displayed) Labs Reviewed  COMPREHENSIVE METABOLIC PANEL - Abnormal; Notable for the following components:      Result Value   Sodium 134 (*)    Chloride 97 (*)    Glucose, Bld 123 (*)    BUN 36 (*)    Creatinine, Ser 1.70 (*)    Total Protein 8.2 (*)    GFR, Estimated 44 (*)    All other components within normal limits  CBC WITH DIFFERENTIAL/PLATELET - Abnormal; Notable for the following components:   WBC 11.5 (*)    All other components within normal limits  CK - Abnormal; Notable for the following components:   Total CK 447 (*)    All other components within normal limits  URINALYSIS, ROUTINE W REFLEX MICROSCOPIC    EKG EKG Interpretation  Date/Time:  Wednesday October 09 2020 18:30:20 EDT Ventricular Rate:  68 PR Interval:  147 QRS Duration: 95 QT Interval:  391 QTC Calculation: 416 R Axis:   33 Text Interpretation: Sinus rhythm Low voltage, precordial leads No acute changes No significant change since last tracing Confirmed by Varney Biles [phone]) on 10/09/2020 8:29:18 PM  Radiology No results found.  Procedures Procedures   Medications Ordered in ED Medications  sodium chloride 0.9 % bolus 1,000 mL (0 mLs Intravenous Stopped 10/09/20 2134)    ED Course  I have reviewed the triage vital signs and the nursing  notes.  Pertinent labs & imaging results that were available during my care of the patient were reviewed by me and considered in my medical decision making (see chart for details).  Clinical Course as of 10/09/20 2235  Wed Oct 09, 2020  2203 Polymyoclonus from [place] [JR]    Clinical Course User Index [JR] Star Resler, [place] N, PA-C   MDM Rules/Calculators/A&P  Patient is 65 year old male presenting for 3 days of muscle cramps and twitching.  No infectious symptoms.  No recent strenuous exercise or prolonged immobilization.  No alcohol use reported.  No drug use reported.  No asterixis or neurologic deficits.  He has sinus bradycardia on cardiac monitor, however has documented history of the same per previous cardiology note.  He has had minimal leukocytosis though no left shift.  His metabolic panel shows small AKI of 1.7, up from 1.4.  No other significant electrolyte abnormalities are noted.  CK is minimally elevated.  UA is negative.  EKG is nonischemic.  He is given IV fluids.  Consulted with neurologist Dr. Leonel Ramsay.  He reviews patient work-up today.  Suspect this is metabolic etiology though unclear source at this time.  Does not find utility muscle relaxers or gabapentin.  Patient is appropriate for outpatient follow-up, he is very eager to leave and has no desire to stay any longer in the emergency department hospital.  He does however express his intentions to follow closely with his primary care tomorrow.  He is instructed to return if symptoms worsen in any way and verbalized understanding of this.  Discussed results, findings, treatment and follow up. Patient advised of return precautions. Patient verbalized understanding and agreed with plan.  Final Clinical Impression(s) / ED Diagnoses Final diagnoses:  Myoclonus  AKI (acute kidney injury) [facility])    Rx / [place] Orders ED Discharge Orders     None        Kristyn Obyrne, [place] N, PA-C 10/09/20 2241     Varney Biles, MD 10/10/20 [phone]

## 2020-10-09 NOTE — ED Provider Notes (Signed)
Emergency Medicine Provider Triage Evaluation Note  Russell Prince 65 y.o. male was evaluated in triage.  Pt complains of diffuse cramping to legs, arms, abdomen.  He reports been ongoing for about 3 days.  He states at times, gets so severe he has difficulty walking.  He states he feels like everything tenses up.  He called his doctor at the [facility] and they advised him to go to the emergency department for further evaluation.  He denies any numbness/weakness, fevers, chills, chest pain, difficulty breathing.  No swelling of his legs.  No recent strenuous workouts, activity.  He has not noted any darker urine.  No recent medication changes.   Review of Systems  Positive: Cramping Negative: Fevers, nausea/vomiting  Physical Exam  BP 134/82   Pulse 70   Temp 98.2 F (36.8 C) (Oral)   Resp 18   Ht 5\' 4"  (1.626 m)   Wt 65.8 kg   SpO2 100%   BMI 24.89 kg/m  Gen:   Awake, no distress  HEENT:  Atraumatic  Resp:  Normal effort  Cardiac:  Normal rate. 2+ raidal pulses Abd:   Nondistended, nontender  MSK:   Moves extremities without difficulty  Neuro:  Speech clear   Other:      Medical Decision Making  Medically screening exam initiated at 1:54 PM  Appropriate orders placed.  Russell Prince was informed that the remainder of the evaluation will be completed by another provider, this initial triage assessment does not replace that evaluation. They are counseled that they will need to remain in the ED until the completion of their workup, including full H&P and results of any tests.  Risks of leaving the emergency department prior to completion of treatment were discussed. Patient was advised to inform ED staff if they are leaving before their treatment is complete. The patient acknowledged these risks and time was allowed for questions.     The patient appears stable so that the remainder of the MSE may be completed by another provider.    Clinical Impression  Cramps   Portions of this  note were generated with Dragon dictation software. Dictation errors may occur despite best attempts at proofreading.     Volanda Napoleon, PA-C 10/09/20 1355    Truddie Hidden, MD 10/09/20 [phone]

## 2020-10-09 NOTE — ED Triage Notes (Signed)
Pt complains of cramps all over his body x 2 days. He called his provider and was told to come to ED to get some blood work.

## 2021-06-05 ENCOUNTER — Encounter (HOSPITAL_COMMUNITY): Payer: Self-pay | Admitting: Student

## 2021-06-05 ENCOUNTER — Emergency Department (HOSPITAL_COMMUNITY)
Admission: EM | Admit: 2021-06-05 | Discharge: 2021-06-05 | Disposition: A | Discharge status: Home / Self Care | Payer: No Typology Code available for payment source | Attending: Emergency Medicine | Admitting: Emergency Medicine

## 2021-06-05 ENCOUNTER — Emergency Department (HOSPITAL_COMMUNITY): Payer: No Typology Code available for payment source

## 2021-06-05 DIAGNOSIS — Z7982 Long term (current) use of aspirin: Secondary | ICD-10-CM | POA: Diagnosis not present

## 2021-06-05 DIAGNOSIS — I209 Angina pectoris, unspecified: Secondary | ICD-10-CM | POA: Diagnosis not present

## 2021-06-05 DIAGNOSIS — R079 Chest pain, unspecified: Secondary | ICD-10-CM | POA: Diagnosis present

## 2021-06-05 DIAGNOSIS — Z79899 Other long term (current) drug therapy: Secondary | ICD-10-CM | POA: Diagnosis not present

## 2021-06-05 DIAGNOSIS — I10 Essential (primary) hypertension: Secondary | ICD-10-CM | POA: Insufficient documentation

## 2021-06-05 DIAGNOSIS — I200 Unstable angina: Secondary | ICD-10-CM

## 2021-06-05 LAB — CBC WITH DIFFERENTIAL/PLATELET
Abs Immature Granulocytes: 0.07 10*3/uL (ref 0.00–0.07)
Basophils Absolute: 0 10*3/uL (ref 0.0–0.1)
Basophils Relative: 0 %
Eosinophils Absolute: 0.1 10*3/uL (ref 0.0–0.5)
Eosinophils Relative: 1 %
HCT: 42.4 % (ref 39.0–52.0)
Hemoglobin: 14.2 g/dL (ref 13.0–17.0)
Immature Granulocytes: 1 %
Lymphocytes Relative: 21 %
Lymphs Abs: 2.3 10*3/uL (ref 0.7–4.0)
MCH: 29.8 pg (ref 26.0–34.0)
MCHC: 33.5 g/dL (ref 30.0–36.0)
MCV: 89.1 fL (ref 80.0–100.0)
Monocytes Absolute: 1.1 10*3/uL — ABNORMAL HIGH (ref 0.1–1.0)
Monocytes Relative: 10 %
Neutro Abs: 7.5 10*3/uL (ref 1.7–7.7)
Neutrophils Relative %: 67 %
Platelets: 179 10*3/uL (ref 150–400)
RBC: 4.76 MIL/uL (ref 4.22–5.81)
RDW: 15.7 % — ABNORMAL HIGH (ref 11.5–15.5)
WBC: 11.1 10*3/uL — ABNORMAL HIGH (ref 4.0–10.5)
nRBC: 0 % (ref 0.0–0.2)

## 2021-06-05 LAB — COMPREHENSIVE METABOLIC PANEL
ALT: 21 U/L (ref 0–44)
AST: 20 U/L (ref 15–41)
Albumin: 3.5 g/dL (ref 3.5–5.0)
Alkaline Phosphatase: 89 U/L (ref 38–126)
Anion gap: 10 (ref 5–15)
BUN: 20 mg/dL (ref 8–23)
CO2: 23 mmol/L (ref 22–32)
Calcium: 8.9 mg/dL (ref 8.9–10.3)
Chloride: 101 mmol/L (ref 98–111)
Creatinine, Ser: 1.55 mg/dL — ABNORMAL HIGH (ref 0.61–1.24)
GFR, Estimated: 49 mL/min — ABNORMAL LOW (ref >60)
Glucose, Bld: 216 mg/dL — ABNORMAL HIGH (ref 70–99)
Potassium: 4.1 mmol/L (ref 3.5–5.1)
Sodium: 134 mmol/L — ABNORMAL LOW (ref 135–145)
Total Bilirubin: 0.2 mg/dL — ABNORMAL LOW (ref 0.3–1.2)
Total Protein: 6.7 g/dL (ref 6.5–8.1)

## 2021-06-05 LAB — TROPONIN I (HIGH SENSITIVITY)
Troponin I (High Sensitivity): 6 ng/L (ref <18)
Troponin I (High Sensitivity): 7 ng/L (ref <18)

## 2021-06-05 MED ORDER — IOHEXOL 350 MG/ML SOLN
95.0000 mL | Freq: Once | INTRAVENOUS | Status: AC | PRN
  Administered 2021-06-05: 95 mL via INTRAVENOUS

## 2021-06-05 MED ORDER — NITROGLYCERIN 0.4 MG SL SUBL: 0.8000 mg | SUBLINGUAL_TABLET | Freq: Once | SUBLINGUAL | Status: AC

## 2021-06-05 MED ORDER — LACTATED RINGERS IV BOLUS
1000.0000 mL | Freq: Once | INTRAVENOUS | Status: AC
  Administered 2021-06-05: 1000 mL via INTRAVENOUS

## 2021-06-05 MED ORDER — AMLODIPINE BESYLATE 5 MG PO TABS
10.0000 mg | ORAL_TABLET | Freq: Every day | ORAL | Status: DC
  Administered 2021-06-05: 10 mg via ORAL
  Filled 2021-06-05: qty 2

## 2021-06-05 MED ORDER — NITROGLYCERIN 0.4 MG SL SUBL
SUBLINGUAL_TABLET | SUBLINGUAL | Status: AC
  Administered 2021-06-05: 0.8 mg via SUBLINGUAL
  Filled 2021-06-05: qty 1

## 2021-06-05 NOTE — Consult Note (Addendum)
Cardiology Consult:   Patient ID: Russell Prince MRN: [id number]; DOB: 10/28/1955   Admission date: 06/05/2021  PCP:  [facility], [facility] Prov[facility] Cardiologist:  Skeet Latch, MD   {  Chief Complaint:  chest pain  Patient Profile:   Russell Prince is a 66 y.o. male with a history of resistant hypertension, hyperlipidemia, GERD, and obesity who is being seen for the evaluation of chest pain at the request of Dr. Kathrynn Humble.  History of Present Illness:   [name] is a 66 year old male with the above history who is followed by Dr. Oval Linsey in her [facility]. He was referred to Dr. Oval Linsey in 02/2020 following an ED visit for hypertensive urgency. Medications were adjusted and secondary work-up of his hypertension was recommended with TSH, aldosterone-renin activity, and renal artery ultrasound. Patient was concerned that this would not be paid for through the [facility] so recommendations were sent to PCP to order. Patient has not been seen in our office since that time.  Patient presented to the ED today for further evaluation of chest pain. Patient was in his usual state of health until early this morning. He states he went to bed last night as usual and woke up around 1am to use the bathroom. Upon waking, he noticed "massive" and "excruciating" left sided chest pain. Pain was so bad that he had trouble taking a deep breath. He got up to use the bathroom and came back and pain persistent. Nothing specific seemed to make the pain worse. He states he has never had this happen before and denied any other recent chest pain. He denies shortness of breath, orthopnea, PND, significant edema, palpitations, lightheadedness/dizziness, syncope. No recent fevers or illnesses. No cough, nasal congestion, nausea, vomiting, or diarrhea.   Given persistent pain, wife called 911. He was instructed to chew 4 baby Aspirin prior to EMS arrival which he did with no improvement. While en route  to the ED, he was given a dose of sublingual Nitro with improvement in pain. Patient estimates the pain lasted for about 1 hour total.  In the ED, patient mildly bradycardic and hypertensive but vitals stable. EKG showed normal sinus rhythm with LVH and T wave inversion in lead III.  High-sensitivity troponin negative x2. Chest x-ray showed no acute findings. WBC 11.1, Hgb 14.2, Plts 179. Na 134, K 4.1, Glucose 216, BUN 20, Cr 1.55. Albumin 3.5, AST 20, ALT 21, Alk Phos 89, Total Bili 0.2. Cardiology consulted for further evaluation.  At the time of this evaluation, patient chest pain free.   He reports compliance with all his home antihypertensives and states home BP is usually in the 120s-130s/80.   He denies any tobacco or alcohol use. He denies any known family history of heart disease or stroke.   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 Authorizing Provider  amLODipine (NORVASC) 10 MG tablet Take 1 tablet (10 mg total) by mouth daily. 05/04/19 07/26/21  Elodia Florence., MD  aspirin EC 81 MG tablet Take 81 mg by mouth daily.  [provider]  atenolol (TENORMIN) 100 MG tablet Take 100 mg by mouth daily.    [provider]  enalapril (VASOTEC) 20 MG tablet Take 20 mg by mouth daily.    [provider]  hydrochlorothiazide (HYDRODIURIL) 25 MG tablet Take 1 tablet (25 mg total) by mouth daily. 03/18/20 07/26/21  Skeet Latch, MD  pantoprazole (PROTONIX) 40 MG tablet Take 40 mg by mouth 2 (two) times daily before a meal.    [provider]  sildenafil (VIAGRA) 50 MG tablet  Take 25 mg by mouth daily as needed for erectile dysfunction.    [provider]     Allergies:    Allergies  Allergen Reactions   Decadron [Dexamethasone] Itching and Other (See Comments)    Pt states he gets a feeling of heat and itching in groin area and head (with IVP)    Social History:   Social History   Socioeconomic History   Marital status: Married    Spouse name: Not on file   Number of children: 4   Years of education: Not on file   Highest education level: Not on file  Occupational History   Not on file  Tobacco Use   Smoking status: Never   Smokeless tobacco: Never  Vaping Use   Vaping Use: Never used  Substance and Sexual Activity   Alcohol use: Never   Drug use: Never   Sexual activity: Not on file  Other Topics Concern   Not on file  Social History Narrative   Not on file   Social Determinants of Health   Financial Resource Strain: Not on file  Food Insecurity: Not on file  Transportation Needs: Not on file  Physical Activity: Not on file  Stress: Not on file  Social Connections: Not on file  Intimate Partner Violence: Not on file    Family History:   The patient's family history includes Asthma in his sister; Cancer in his mother; Colon cancer in his maternal uncle; Diabetes in his sister; Hypertension in his mother and sister; Lung cancer in his mother; Lung disease in his sister; Stroke in his maternal aunt. There is no history of Stomach cancer or Esophageal cancer.    ROS:  Please see the history of present illness.  All other ROS reviewed and negative.     Physical Exam/Data:   Vitals:   06/05/21 1000 06/05/21 1015 06/05/21 1030 06/05/21 1200  BP: (!) 171/100 (!) 148/101 (!) 157/98 (!) 158/105  Pulse: (!) 52 (!) 48 (!) 49 (!) 48  Resp: (!) $RemoveB'21 19  20  'MXSvemIM$ Temp:      TempSrc:      SpO2: 97% 98% 98% 99%   No intake or output data in the 24 hours ending 06/05/21 1353 Last 3 Weights 04/17/2020 03/18/2020 05/01/2019  Weight (lbs)  214 lb 213 lb 194 lb 12.4 oz  Weight (kg) 97.07 kg 96.616 kg 88.35 kg     There is no height or weight on file to calculate BMI.  General: 66 y.o. African-American male resting comfortably in no acute distress. HEENT: Normocephalic and atraumatic. Sclera clear.  Neck: Supple. No carotid bruits. No JVD. Heart: Bradycardic with normal rhythm. Soft 1/VI systolic murmur. No gallops or rubs.  Distinct S1 and S2. Radial pulses 2+ and equal bilaterally. Lungs: No increased work of breathing. Clear to ausculation bilaterally. No wheezes, rhonchi, or rales.  Abdomen: Soft, non-distended, and non-tender to palpation. Bowel sounds present. Extremities: No lower extremity edema.  Skin: Warm and dry. Neuro: Alert and oriented x3. No focal deficits. Psych: Normal affect. Responds appropriately.  EKG:  The ECG that was done was personally reviewed and demonstrates: Normal sinus rhythm, rate 68 bpm, with LVH and T wave inversion in lead III. Left axis deviation. Normal PR and QRS intervals. QTc 416 ms.  Telemetry: Telemetry personally reviewed and demonstrates: Sinus bradycardia with rates mostly in the 50s.   Relevant CV Studies: N/A  Laboratory Data:  High Sensitivity Troponin:   Recent Labs  Lab 06/05/21 0144 06/05/21 0401  TROPONINIHS 6 7      Chemistry Recent Labs  Lab 06/05/21 0144  NA 134*  K 4.1  CL 101  CO2 23  GLUCOSE 216*  BUN 20  CREATININE 1.55*  CALCIUM 8.9  GFRNONAA 49*  ANIONGAP 10    Recent Labs  Lab 06/05/21 0144  PROT 6.7  ALBUMIN 3.5  AST 20  ALT 21  ALKPHOS 89  BILITOT 0.2*   Lipids No results for input(s): CHOL, TRIG, HDL, LABVLDL, LDLCALC, CHOLHDL in the last 168 hours. Hematology Recent Labs  Lab 06/05/21 0144  WBC 11.1*  RBC 4.76  HGB 14.2  HCT 42.4  MCV 89.1  MCH 29.8  MCHC 33.5  RDW 15.7*  PLT 179   Thyroid No results for input(s): TSH, FREET4 in the last 168 hours. BNPNo results for input(s): BNP, PROBNP in the last 168 hours.   DDimer No results for input(s): DDIMER in the last 168 hours.   Radiology/Studies:  DG Chest 2 View  Result Date: 06/05/2021 CLINICAL DATA:  Chest pain. EXAM: CHEST - 2 VIEW COMPARISON:  Chest radiograph dated 04/30/2019. FINDINGS: No focal consolidation, pleural effusion or pneumothorax. The cardiac silhouette is within limits. No acute osseous pathology. IMPRESSION: No active cardiopulmonary disease.  Number Electronically Signed   By: Anner Crete M.D.   On: 06/05/2021 02:04     Assessment and Plan:   Chest Pain Patient presents with an isolated episode of severe chest pain that occurred at rest. EKG shows normal sinus rhythm with LVH and T wave inversion in lead III. High-sensitivity troponin negative x2. Pain resolved with Nitro. - Currently chest pain free. - Patient has both typical and atypical features. However, he has multiple CV risk factors and has upcoming surgery planned for torn rotator cuff. I think he would benefit from a coronary CTA. Will discuss with MD. I will reach out to coordinator to see if there is anyway we could do CTA today.  Hypertension BP elevated but he has not received any of his home medications yet. Home medications include: Amlodipine 10mg  daily, Enalapril 20mg  daily, HCTZ 25mg  daily, and Atenolol 100mg  daily.  - Will go ahead and order home Amlodipine. - Will hold Enalapril and HCTZ in anticipation of coronary CTA given renal function.  - May need to decrease dose of Atenolol given baseline bradycardia.   Hyperlipidemia Hyperlipidemia listed in chart but patient not on any medications. - If patient stays until tomorrow, can recheck fasting lipid panel in the morning.  Hyperglycemia Glucose 216 on CMET. He denies any history of diabetes. - If patient stays until tomorrow, will check hemoglobin A1c.   CKD Stage III Creatinine 1.55 on admission. Last know creatinine was 1.7 in 09/2020. - Continue to monitor.   Risk Assessment/Risk Scores:    HEAR Score (for undifferentiated chest pain):  HEAR Score: 5{   For questions or updates, please contact [facility] Please consult [email or web address] for contact info under  Signed, Darreld Mclean, PA-C  06/05/2021 1:53 PM    History and all data above reviewed.  Patient examined.  I agree with the findings as above.  Patient presented with chest pain as described.  He reports that he has not had this before.  He does have cardiovascular risk factors.  EKG did not demonstrate acute changes but his pain was relieved with sublingual nitroglycerin.  Cardiac enzymes were negative but his pain was not of long duration.  EKG was nonacute.  He is found to have hyperglycemia and has not been told previously that he had diabetes.  He has a mildly elevated white blood cell count.  He gets all of his care for most of it from the [place].  He says that prior to the discomfort today he does do some yard work although of course not as much during the winter.  He denies any chest pressure with this.  He has had no new shortness of breath, PND or orthopnea.  The patient exam reveals COR: Regular rate and rhythm, no murmurs,  Lungs: Clear to auscultation bilaterally,  Abd: Positive bowel sounds normal in frequency and pitch, bruits, rebound, guarding, Ext 2+ pulses, no edema.  .  All available labs, radiology testing, previous records reviewed. Agree with documented assessment and plan.  Chest pain: Patient has a moderate pretest probability of obstructive coronary diseas as his pain has some features consistent with unstable angina.  We will screen him with coronary CT.  Hyperglycemia: I talked to him about this following up with his primary providers given this.  This was not a fasting blood sugar but he understands it is significantly elevated.   CKD his creatinine is 1.55 baseline.  He should have follow-up labs per his primary provider.    Jeneen Rinks Ovella [place]  4:27 PM  06/05/2021

## 2021-06-05 NOTE — ED Provider Notes (Addendum)
[facility] EMERGENCY DEPARTMENT Provider Note   CSN: [id number] Arrival date & time: 06/05/21  0133     History  Chief Complaint  Patient presents with   Chest Pain    Russell Prince is a 66 y.o. male.  HPI  HPI: A 66 year old patient with a history of hypertension and hypercholesterolemia presents for evaluation of chest pain. Initial onset of pain was more than 6 hours ago. The patient's chest pain is not worse with exertion and is relieved by nitroglycerin. The patient's chest pain is middle- or left-sided, is not well-localized, is not described as heaviness/pressure/tightness, is not sharp and does not radiate to the arms/jaw/neck. The patient does not complain of nausea and denies diaphoresis. The patient has no history of stroke, has no history of peripheral artery disease, has not smoked in the past 90 days, denies any history of treated diabetes, has no relevant family history of coronary artery disease (first degree relative at less than age 46) and does not have an elevated BMI (>=30).   Chest pain is described as sharp pain, left-sided, unprovoked.  Did get better with nitroglycerin that was given by EMS.   Home Medications Prior to Admission medications   Medication Sig Start Date End Date Taking? Authorizing Provider  amLODipine (NORVASC) 10 MG tablet Take 1 tablet (10 mg total) by mouth daily. 05/04/19 07/26/21  Elodia Florence., MD  aspirin EC 81 MG tablet Take 81 mg by mouth daily.    [provider]  atenolol (TENORMIN) 100 MG tablet Take 100 mg by mouth daily.    [provider]  enalapril (VASOTEC) 20 MG tablet Take 20 mg by mouth daily.    [provider]  hydrochlorothiazide (HYDRODIURIL) 25 MG tablet Take 1 tablet (25 mg total) by mouth daily. 03/18/20 07/26/21  Skeet Latch, MD  pantoprazole (PROTONIX) 40 MG tablet Take 40 mg by mouth 2 (two) times daily before a meal.    [provider]  sildenafil  (VIAGRA) 50 MG tablet Take 25 mg by mouth daily as needed for erectile dysfunction.    [provider]      Allergies    Decadron [dexamethasone]    Review of Systems   Review of Systems  All other systems reviewed and are negative.  Physical Exam Updated Vital Signs BP (!) 161/101    Pulse (!) 48    Temp 98 F (36.7 C) (Oral)    Resp (!) 23    SpO2 99%  Physical Exam Vitals and nursing note reviewed.  Constitutional:      Appearance: He is well-developed.  HENT:     Head: Atraumatic.  Cardiovascular:     Rate and Rhythm: Normal rate.  Pulmonary:     Effort: Pulmonary effort is normal.  Musculoskeletal:     Cervical back: Neck supple.     Right lower leg: No edema.     Left lower leg: No edema.  Skin:    General: Skin is warm.  Neurological:     Mental Status: He is alert and oriented to person, place, and time.    ED Results / Procedures / Treatments   Labs (all labs ordered are listed, but only abnormal results are displayed) Labs Reviewed  COMPREHENSIVE METABOLIC PANEL - Abnormal; Notable for the following components:      Result Value   Sodium 134 (*)    Glucose, Bld 216 (*)    Creatinine, Ser 1.55 (*)    Total  Bilirubin 0.2 (*)    GFR, Estimated 49 (*)    All other components within normal limits  CBC WITH DIFFERENTIAL/PLATELET - Abnormal; Notable for the following components:   WBC 11.1 (*)    RDW 15.7 (*)    Monocytes Absolute 1.1 (*)    All other components within normal limits  TROPONIN I (HIGH SENSITIVITY)  TROPONIN I (HIGH SENSITIVITY)    EKG EKG Interpretation  Date/Time:  Thursday June 05 2021 08:06:57 EST Ventricular Rate:  52 PR Interval:  133 QRS Duration: 93 QT Interval:  432 QTC Calculation: 402 R Axis:   8 Text Interpretation: Sinus rhythm Probable anteroseptal infarct, old No significant change since last tracing Confirmed by Varney Biles [phone]) on 06/05/2021 8:13:17 AM  Radiology DG Chest 2 View  Result Date:  06/05/2021 CLINICAL DATA:  Chest pain. EXAM: CHEST - 2 VIEW COMPARISON:  Chest radiograph dated 04/30/2019. FINDINGS: No focal consolidation, pleural effusion or pneumothorax. The cardiac silhouette is within limits. No acute osseous pathology. IMPRESSION: No active cardiopulmonary disease.  Number Electronically Signed   By: Anner Crete M.D.   On: 06/05/2021 02:04   CT CORONARY MORPH W/CTA COR W/SCORE W/CA W/CM &/OR WO/CM  Result Date: 06/05/2021 EXAM: OVER-READ INTERPRETATION  CT CHEST The following report is an over-read performed by radiologist Dr. Suzy Bouchard of [facility], [place] on 06/05/2021. This over-read does not include interpretation of cardiac or coronary anatomy or pathology. The coronary calcium score/coronary CTA interpretation by the cardiologist is attached. COMPARISON:  None. FINDINGS: Limited view of the lung parenchyma demonstrates no suspicious nodularity. Airways are normal. Limited view of the mediastinum demonstrates no adenopathy. Esophagus normal. Limited view of the upper abdomen unremarkable. Limited view of the skeleton and chest wall is unremarkable. IMPRESSION: No significant extracardiac findings. Electronically Signed   By: Suzy Bouchard M.D.   On: 06/05/2021 15:19    Procedures Procedures    Medications Ordered in ED Medications  amLODipine (NORVASC) tablet 10 mg (10 mg Oral Given 06/05/21 1618)  lactated ringers bolus 1,000 mL (0 mLs Intravenous Stopped 06/05/21 1623)  nitroGLYCERIN (NITROSTAT) SL tablet 0.8 mg (0.8 mg Sublingual Given 06/05/21 1453)  iohexol (OMNIPAQUE) 350 MG/ML injection 95 mL (95 mLs Intravenous Contrast Given 06/05/21 1509)    ED Course/ Medical Decision Making/ A&P Clinical Course as of 06/05/21 1639  Thu Jun 05, 2021  1638 Spoke with patient over the phone after he was discharged.  Advised him to get his creatinine rechecked in a week given that it is higher than his baseline in 2022 and he received contrast.  Also, he  asked me about his blood glucose.  His sugar is 200, but it was not a fasting sugar.  Advised him to have his PCP check his sugar as well.  Patient also made aware that he will receive a call from cardiology if his CT is concerning. [AN]    Clinical Course User Index [AN] Varney Biles, MD   HEAR Score: 5                       Medical Decision Making 66 year old male comes in with chief complaint of chest pain He has history of hypertension and left-sided rotator cuff injury.  Indicates having sharp, severe chest pain on the left side at home with associated shortness of breath.  He called EMS, nitroglycerin was given which helped him.  Now he is chest pain-free.  Patient is EKG has no signs of acute  ischemia.  His restratification score is 5.   I considered admission to the hospital for ACS rule out, however it normal troponin and atypical pain with reassuring EKG, Case discussed with cardiology to ensure safe pathway for the patient.  Dr. Percival Spanish will see the patient and decide if he needs coronary CT.  Differential diagnosis for this patient includes ACS, musculoskeletal pain, gastritis, referred pain from shoulder injury.   Reassessment (late entry); Cardiology recs from Dr. Loletha Grayer, reviewed by Dr. Lemmie Evens and I: "Cardiac CT suggestive of mild non-obstructive disease.  Based on chart review, patient may be able for discharge without patient follow up."  Final Clinical Impression(s) / ED Diagnoses Final diagnoses:  Angina pectoris [facility])    Rx / DC Orders ED Discharge Orders     None            Varney Biles, MD 06/05/21 [place], [place], MD 06/06/21 [phone]

## 2021-06-05 NOTE — ED Notes (Signed)
Pt ambulatory to restroom with steady gait.

## 2021-06-05 NOTE — ED Provider Triage Note (Signed)
Emergency Medicine Provider Triage Evaluation Note  Russell Prince , a 66 y.o. male  was evaluated in triage.  Pt complains of chest pain that began approximately 1 hour PTA. Patient states he was laying down when he got a "catching/grabbing" type pain to the left chest, non radiating, with dyspnea and increasing pain when trying to get better air movement. Pain persisted, called 911, took 324 mg of chewable aspirin, EMS gave nitroglycerin x 1 and pain is now resolved. Denies nausea, vomiting, diaphoresis, leg pain/swelling, hemoptysis, recent surgery/trauma, recent long travel, hormone use, personal hx of cancer, or hx of DVT/PE. Denies hx of CAD. Hx of HTN. Denies tobacco use..  Review of Systems  Per HPI  Physical Exam  BP (!) 139/102 (BP Location: Right Arm)    Pulse 78    Temp 99.2 F (37.3 C) (Oral)    Resp 18    SpO2 100%  Gen:   Awake, no distress   Resp:  Normal effort  MSK:   Moves extremities without difficulty  Other:  Symmetric pulses, no le edema or calf tenderness.   Medical Decision Making  Medically screening exam initiated at 1:40 AM.  Appropriate orders placed.  Marti Acebo was informed that the remainder of the evaluation will be completed by another provider, this initial triage assessment does not replace that evaluation, and the importance of remaining in the ED until their evaluation is complete.  Chest pain work-up initiated, pain free currently- patient made aware that if pain returns at any point in time during his wait he needs to make staff aware. He has had aspirin prior to arrival.    Amaryllis Dyke, [place] 06/05/21 0145

## 2021-06-05 NOTE — ED Triage Notes (Addendum)
Left sided chest pain, states it is stabbing pain that awakened patient from sleep. Pain increases with movement. Denies any nausea, vomiting, or dizziness. Nitro x1 and ASA 324mg  PO administered by EMS PTA. EMS endorses relief with nitro.

## 2021-06-05 NOTE — ED Notes (Signed)
Cardiology and cards RN are bedside for pt IV AND CONSULT

## 2021-06-05 NOTE — Discharge Instructions (Signed)
You are seen in the ER for chest pain.  Your coronary CT scan is normal.  Your heart enzymes were also normal.  This means that you did not have any heart attack prior to ER arrival and the CT scan is not showing any concerns for major blockages.  Continue taking 81 mg aspirin daily and follow-up with your primary care doctor in 1 week .

## 2021-10-24 ENCOUNTER — Other Ambulatory Visit: Payer: Self-pay

## 2021-10-24 ENCOUNTER — Emergency Department (HOSPITAL_COMMUNITY)
Admission: EM | Admit: 2021-10-24 | Discharge: 2021-10-24 | Disposition: A | Discharge status: Home / Self Care | Payer: No Typology Code available for payment source | Attending: Emergency Medicine | Admitting: Emergency Medicine

## 2021-10-24 ENCOUNTER — Encounter (HOSPITAL_COMMUNITY): Payer: Self-pay | Admitting: Emergency Medicine

## 2021-10-24 DIAGNOSIS — Z7982 Long term (current) use of aspirin: Secondary | ICD-10-CM | POA: Insufficient documentation

## 2021-10-24 DIAGNOSIS — I10 Essential (primary) hypertension: Secondary | ICD-10-CM | POA: Diagnosis present

## 2021-10-24 DIAGNOSIS — Z79899 Other long term (current) drug therapy: Secondary | ICD-10-CM | POA: Diagnosis not present

## 2021-10-24 LAB — CBC
HCT: 44 % (ref 39.0–52.0)
Hemoglobin: 14.3 g/dL (ref 13.0–17.0)
MCH: 29.1 pg (ref 26.0–34.0)
MCHC: 32.5 g/dL (ref 30.0–36.0)
MCV: 89.6 fL (ref 80.0–100.0)
Platelets: 172 10*3/uL (ref 150–400)
RBC: 4.91 MIL/uL (ref 4.22–5.81)
RDW: 14.1 % (ref 11.5–15.5)
WBC: 6.4 10*3/uL (ref 4.0–10.5)
nRBC: 0 % (ref 0.0–0.2)

## 2021-10-24 LAB — BASIC METABOLIC PANEL
Anion gap: 11 (ref 5–15)
BUN: 18 mg/dL (ref 8–23)
CO2: 27 mmol/L (ref 22–32)
Calcium: 9.5 mg/dL (ref 8.9–10.3)
Chloride: 102 mmol/L (ref 98–111)
Creatinine, Ser: 1.59 mg/dL — ABNORMAL HIGH (ref 0.61–1.24)
GFR, Estimated: 47 mL/min — ABNORMAL LOW (ref >60)
Glucose, Bld: 98 mg/dL (ref 70–99)
Potassium: 3.5 mmol/L (ref 3.5–5.1)
Sodium: 140 mmol/L (ref 135–145)

## 2021-10-24 LAB — TROPONIN I (HIGH SENSITIVITY)
Troponin I (High Sensitivity): 6 ng/L (ref <18)
Troponin I (High Sensitivity): 6 ng/L (ref <18)

## 2021-10-24 NOTE — ED Triage Notes (Signed)
Pt reported to ED with c/o elevated home BP readings. States initial reading was 220ish/109, and is unsure of what other readings were. Is unsure of whether he has having chest pain earlier in the day. Denies any headache, shortness of breath or dizziness at this time.

## 2021-10-24 NOTE — ED Notes (Signed)
Has intermittently been checking BP at home and came in due to concern for BP. Denies any symptoms at this time.

## 2021-10-24 NOTE — ED Provider Notes (Signed)
[facility] Provider Note   CSN: [id number] Arrival date & time: 10/24/21  0009     History  Chief Complaint  Patient presents with   Hypertension    Russell Prince is a 66 y.o. male.  66 year old male with history of hypertension on multiple antihypertensives that presents the ER today secondary to asymptomatic high blood pressure.  Patient states he checked his blood pressures mostly every morning.  He states that he checked it yesterday morning and it was 409 systolic over 811 diastolic.  States this is significantly high for him.  He does not have any vision changes, neurologic changes, chest pain, headache, shortness of breath, lower extremity swelling, abdominal pain, back pain or change in urination.  Patient states that he checked a couple times throughout the day and it remained high so he presented here for further evaluation.  Has a history of having some blood pressure issues in the past.  Follows with the [place].  No recent medication changes, alcohol, drugs, tobacco or over-the-counter medications.  No recent trauma.   Hypertension       Home Medications Prior to Admission medications   Medication Sig Start Date End Date Taking? Authorizing Provider  amLODipine (NORVASC) 10 MG tablet Take 1 tablet (10 mg total) by mouth daily. 05/04/19 07/26/21  Elodia Florence., MD  aspirin EC 81 MG tablet Take 81 mg by mouth daily.    [provider]  atenolol (TENORMIN) 100 MG tablet Take 100 mg by mouth daily.    [provider]  enalapril (VASOTEC) 20 MG tablet Take 20 mg by mouth daily.    [provider]  hydrochlorothiazide (HYDRODIURIL) 25 MG tablet Take 1 tablet (25 mg total) by mouth daily. 03/18/20 07/26/21  Skeet Latch, MD  pantoprazole (PROTONIX) 40 MG tablet Take 40 mg by mouth 2 (two) times daily before a meal.    [provider]  sildenafil (VIAGRA) 50 MG tablet Take 25 mg by mouth daily as  needed for erectile dysfunction.    [provider]      Allergies    Decadron [dexamethasone]    Review of Systems   Review of Systems  Physical Exam Updated Vital Signs BP (!) 180/95 (BP Location: Right Arm)   Pulse (!) 56   Temp 98.3 F (36.8 C) (Oral)   Resp 18   Ht '5\' 8"'$  (1.727 m)   Wt 95.3 kg   SpO2 99%   BMI 31.93 kg/m  Physical Exam Vitals and nursing note reviewed.  Constitutional:      Appearance: He is well-developed.  HENT:     Head: Normocephalic and atraumatic.     Mouth/Throat:     Mouth: Mucous membranes are moist.  Eyes:     Pupils: Pupils are equal, round, and reactive to light.  Cardiovascular:     Rate and Rhythm: Normal rate.  Pulmonary:     Effort: Pulmonary effort is normal. No respiratory distress.  Abdominal:     General: Abdomen is flat. There is no distension.  Musculoskeletal:        General: Normal range of motion.     Cervical back: Normal range of motion.  Skin:    General: Skin is warm and dry.  Neurological:     General: No focal deficit present.     Mental Status: He is alert.     ED Results / Procedures / Treatments   Labs (all labs ordered are listed, but  only abnormal results are displayed) Labs Reviewed  BASIC METABOLIC PANEL - Abnormal; Notable for the following components:      Result Value   Creatinine, Ser 1.59 (*)    GFR, Estimated 47 (*)    All other components within normal limits  CBC  TROPONIN I (HIGH SENSITIVITY)  TROPONIN I (HIGH SENSITIVITY)    EKG EKG Interpretation  Date/Time:  Friday October 24 2021 01:00:40 EDT Ventricular Rate:  49 PR Interval:  146 QRS Duration: 74 QT Interval:  432 QTC Calculation: 390 R Axis:   28 Text Interpretation: Sinus bradycardia Cannot rule out Anterior infarct , age undetermined Abnormal ECG When compared with ECG of 05-Jun-2021 08:06, PREVIOUS ECG IS PRESENT Confirmed by Merrily Pew [phone]) on 10/24/2021 4:14:20 AM  Radiology No results  found.  Procedures Procedures    Medications Ordered in ED Medications - No data to display  ED Course/ Medical Decision Making/ A&P                           Medical Decision Making Amount and/or Complexity of Data Reviewed Labs: ordered.   66 yo M w/ asymptomatic hypertension.  No evidence of endorgan damage related to blood pressure.  Labs reassuring.  We will keep a log of his blood pressures for the next week or 2 and follow-up with his primary doctor.  Return here for any symptoms related to that.   Final Clinical Impression(s) / ED Diagnoses Final diagnoses:  Hypertension, unspecified type    Rx / DC Orders ED Discharge Orders     None         Eden Rho, Corene Cornea, MD 10/24/21 [phone]

## 2022-04-19 ENCOUNTER — Encounter (HOSPITAL_BASED_OUTPATIENT_CLINIC_OR_DEPARTMENT_OTHER): Payer: Self-pay

## 2022-04-19 ENCOUNTER — Emergency Department (HOSPITAL_BASED_OUTPATIENT_CLINIC_OR_DEPARTMENT_OTHER): Payer: No Typology Code available for payment source

## 2022-04-19 ENCOUNTER — Emergency Department (HOSPITAL_BASED_OUTPATIENT_CLINIC_OR_DEPARTMENT_OTHER)
Admission: EM | Admit: 2022-04-19 | Discharge: 2022-04-19 | Disposition: A | Discharge status: Home / Self Care | Payer: No Typology Code available for payment source | Attending: Emergency Medicine | Admitting: Emergency Medicine

## 2022-04-19 ENCOUNTER — Other Ambulatory Visit: Payer: Self-pay

## 2022-04-19 DIAGNOSIS — R059 Cough, unspecified: Secondary | ICD-10-CM | POA: Diagnosis present

## 2022-04-19 DIAGNOSIS — I10 Essential (primary) hypertension: Secondary | ICD-10-CM | POA: Insufficient documentation

## 2022-04-19 DIAGNOSIS — B9789 Other viral agents as the cause of diseases classified elsewhere: Secondary | ICD-10-CM | POA: Insufficient documentation

## 2022-04-19 DIAGNOSIS — J028 Acute pharyngitis due to other specified organisms: Secondary | ICD-10-CM | POA: Insufficient documentation

## 2022-04-19 DIAGNOSIS — Z1152 Encounter for screening for COVID-19: Secondary | ICD-10-CM | POA: Insufficient documentation

## 2022-04-19 DIAGNOSIS — Z79899 Other long term (current) drug therapy: Secondary | ICD-10-CM | POA: Diagnosis not present

## 2022-04-19 DIAGNOSIS — Z7982 Long term (current) use of aspirin: Secondary | ICD-10-CM | POA: Insufficient documentation

## 2022-04-19 DIAGNOSIS — J069 Acute upper respiratory infection, unspecified: Secondary | ICD-10-CM | POA: Insufficient documentation

## 2022-04-19 DIAGNOSIS — Z8616 Personal history of COVID-19: Secondary | ICD-10-CM | POA: Diagnosis not present

## 2022-04-19 LAB — RESP PANEL BY RT-PCR (RSV, FLU A&B, COVID)  RVPGX2
Influenza A by PCR: NEGATIVE
Influenza B by PCR: NEGATIVE
Resp Syncytial Virus by PCR: NEGATIVE
SARS Coronavirus 2 by RT PCR: NEGATIVE

## 2022-04-19 LAB — GROUP A STREP BY PCR: Group A Strep by PCR: NOT DETECTED

## 2022-04-19 MED ORDER — BENZONATATE 100 MG PO CAPS
100.0000 mg | ORAL_CAPSULE | Freq: Once | ORAL | Status: AC
  Administered 2022-04-19: 100 mg via ORAL
  Filled 2022-04-19: qty 1

## 2022-04-19 MED ORDER — ACETAMINOPHEN 325 MG PO TABS
650.0000 mg | ORAL_TABLET | Freq: Once | ORAL | Status: AC
  Administered 2022-04-19: 650 mg via ORAL
  Filled 2022-04-19: qty 2

## 2022-04-19 MED ORDER — LIDOCAINE VISCOUS HCL 2 % MT SOLN
15.0000 mL | Freq: Once | OROMUCOSAL | Status: AC
  Administered 2022-04-19: 15 mL via OROMUCOSAL
  Filled 2022-04-19: qty 15

## 2022-04-19 MED ORDER — PREDNISONE 10 MG PO TABS: 10.0000 mg | ORAL_TABLET | Freq: Two times a day (BID) | ORAL | 0 refills | Status: AC

## 2022-04-19 MED ORDER — BENZONATATE 100 MG PO CAPS: 100.0000 mg | ORAL_CAPSULE | Freq: Three times a day (TID) | ORAL | 0 refills | Status: AC

## 2022-04-19 NOTE — ED Triage Notes (Signed)
C/o cough, headaches, chills, sore throat x 1 day.

## 2022-04-19 NOTE — ED Notes (Signed)
Reviewed discharge instructions, medications and follow up with pt Pt states understanding. Ambulatory to discharge

## 2022-04-19 NOTE — Discharge Instructions (Signed)
As we discussed your symptoms are consistent with a viral upper respiratory infection.  You do not have COVID or the flu.  I encourage plenty of fluids, rest. You can use Tylenol for any fever, chills, ibuprofen for any body aches or headache. If you feel like you have sinus pressure congestion you can consider some over-the-counter saline nose spray, or a Nettie pot.  You can use over-the-counter Mucinex, or other cough and cold medication for congestion, sore throat, cough.  If your symptoms worsen, you develop significant shortness of breath or chest pain please return to the emergency department for further evaluation.

## 2022-04-19 NOTE — ED Provider Notes (Signed)
[facility] EMERGENCY DEPARTMENT Provider Note   CSN: [id number] Arrival date & time: 04/19/22  2263     History  Chief Complaint  Patient presents with   URI    Jemel Ono is a 66 y.o. male with past medical history significant for hypertension, hyperlipidemia, acid reflux, resistant hypertension, obesity, anemia who presents with concern for cough, headaches, chills, sore throat for 1 day.  Patient reports that he had hacking cough overnight, reports some shortness of breath associated with cough, and endorses some chest pain mostly located on the right and central side only when coughing, denies any chest pain at rest.  Denies any exertional chest pain.  He denies any significant fever, chills.  Patient arrives somewhat hypertensive, but reports taking his blood pressure medication as prescribed this morning.   URI Presenting symptoms: cough        Home Medications Prior to Admission medications   Medication Sig Start Date End Date Taking? Authorizing Provider  benzonatate (TESSALON) 100 MG capsule Take 1 capsule (100 mg total) by mouth every 8 (eight) hours. 04/19/22  Yes Cayleigh Paull H, PA-C  predniSONE (DELTASONE) 10 MG tablet Take 1 tablet (10 mg total) by mouth 2 (two) times daily with a meal. 04/19/22  Yes Zyasia Halbleib H, PA-C  amLODipine (NORVASC) 10 MG tablet Take 1 tablet (10 mg total) by mouth daily. 05/04/19 07/26/21  Elodia Florence., MD  aspirin EC 81 MG tablet Take 81 mg by mouth daily.    [provider]  atenolol (TENORMIN) 100 MG tablet Take 100 mg by mouth daily.    [provider]  enalapril (VASOTEC) 20 MG tablet Take 20 mg by mouth daily.    [provider]  hydrochlorothiazide (HYDRODIURIL) 25 MG tablet Take 1 tablet (25 mg total) by mouth daily. 03/18/20 07/26/21  Skeet Latch, MD  pantoprazole (PROTONIX) 40 MG tablet Take 40 mg by mouth 2 (two) times daily before a meal.    [provider]  sildenafil (VIAGRA) 50 MG tablet Take 25 mg by mouth daily as needed for erectile dysfunction.    [provider]      Allergies    Decadron [dexamethasone]    Review of Systems   Review of Systems  Respiratory:  Positive for cough and shortness of breath.   All other systems reviewed and are negative.   Physical Exam Updated Vital Signs BP (!) 180/116   Pulse 68   Temp 98.6 F (37 C) (Oral)   Resp 18   Ht 5' 8.5" (1.74 m)   Wt 97.5 kg   SpO2 99%   BMI 32.22 kg/m  Physical Exam Vitals and nursing note reviewed.  Constitutional:      General: He is not in acute distress.    Appearance: Normal appearance.  HENT:     Head: Normocephalic and atraumatic.     Mouth/Throat:     Comments: Patient does have moderately erythematous posterior oropharynx with small amount of whitish exudate.  Tonsils are 1+ bilaterally.  Uvula is midline.  Intact swallow reflex.  Patient with somewhat crowded posterior oropharynx, Mallampati 3.  No evidence of abscess formation, dental infection,, redness, swelling.  No floor of mouth redness, swelling, or fluctuance. Eyes:     General:        Right eye: No discharge.        Left eye: No discharge.  Cardiovascular:     Rate and Rhythm: Normal rate and regular rhythm.  Heart sounds: No murmur heard.    No friction rub. No gallop.  Pulmonary:     Effort: Pulmonary effort is normal.     Breath sounds: Normal breath sounds.     Comments: Very mild expiratory wheeze, no significant rhonchi Abdominal:     General: Bowel sounds are normal.     Palpations: Abdomen is soft.  Skin:    General: Skin is warm and dry.     Capillary Refill: Capillary refill takes less than 2 seconds.  Neurological:     Mental Status: He is alert and oriented to person, place, and time.  Psychiatric:        Mood and Affect: Mood normal.        Behavior: Behavior normal.     ED Results / Procedures / Treatments   Labs (all labs ordered  are listed, but only abnormal results are displayed) Labs Reviewed  RESP PANEL BY RT-PCR (RSV, FLU A&B, COVID)  [id number]  GROUP A STREP BY PCR    EKG None  Radiology DG Chest 2 View  Result Date: 04/19/2022 CLINICAL DATA:  Shortness of breath EXAM: CHEST - 2 VIEW COMPARISON:  06/05/2021 FINDINGS: The heart size and mediastinal contours are within normal limits. Both lungs are clear. The visualized skeletal structures are unremarkable. IMPRESSION: No active cardiopulmonary disease. Electronically Signed   By: Davina Poke D.O.   On: 04/19/2022 09:19    Procedures Procedures    Medications Ordered in ED Medications  lidocaine (XYLOCAINE) 2 % viscous mouth solution 15 mL (15 mLs Mouth/Throat Given 04/19/22 0918)  acetaminophen (TYLENOL) tablet 650 mg (650 mg Oral Given 04/19/22 0917)  benzonatate (TESSALON) capsule 100 mg (100 mg Oral Given 04/19/22 9628)    ED Course/ Medical Decision Making/ A&P                           Medical Decision Making Amount and/or Complexity of Data Reviewed Radiology: ordered.  Risk OTC drugs. Prescription drug management.   This is a well-appearing 66yo male who presents with concern for 3 days of cough, fever, sore throat, headache, shob.  My emergent differential diagnosis includes acute upper respiratory infection with COVID, flu, RSV versus new asthma presentation, acute bronchitis, less clinical concern for pneumonia.  Also considered other ENT emergencies, Ludwig angina, strep pharyngitis, mono, versus epiglottis, tonsillitis versus other.  This is not an exhaustive differential.  On my exam patient is overall well-appearing, they have temperature of 98.6, breathing unlabored, no tachypnea, no respiratory distress, stable oxygen saturation.  Patient without tachycardia.  RVP independently reviewed by myself shows no evidence of COVID flu, RSV, PCR for strep also negative at this time. I independently interpreted imaging including plain film  chest x-ray which shows no acute intrathoracic abnormality. I agree with the radiologist interpretation.  Patient symptoms are consistent with respiratory infection of likely viral nature, as well as viral pharyngitis, patient does have some mild expiratory wheeze, and is having hacking cough, discussed conservative treatment versus very low-dose of steroids, discussed that I would be very cautious taking steroids given his poorly controlled hypertension, patient understands agrees to plan, will monitor for any worsening symptoms while taking, will also discharge with Tessalon for cough.  Encouraged ibuprofen, Tylenol, rest, plenty of fluids.  Discussed extensive return precautions.  Patient discharged in stable condition at this time.  Final Clinical Impression(s) / ED Diagnoses Final diagnoses:  Viral upper respiratory tract infection  Sore throat (viral)  Rx / DC Orders ED Discharge Orders          Ordered    predniSONE (DELTASONE) 10 MG tablet  2 times daily with meals        04/19/22 1032    benzonatate (TESSALON) 100 MG capsule  Every 8 hours        04/19/22 1032              Deslyn Cavenaugh, [place] H, PA-C 04/19/22 [place], Rosa K, DO 04/19/22 1554

## 2022-10-23 ENCOUNTER — Encounter: Payer: Self-pay | Admitting: Gastroenterology

## 2023-11-27 IMAGING — CT CT HEART MORP W/ CTA COR W/ SCORE W/ CA W/CM &/OR W/O CM
1 of 2 series · 5 of 16 positions shown, 7 images · non-contrast
Comparison: None.
COMPARISON: None.

Addendum:
EXAM:
OVER-READ INTERPRETATION  CT CHEST

The following report is an over-read performed by radiologist Dr.
Alfaia Matias Dawir [REDACTED] on 06/05/2021. This
over-read does not include interpretation of cardiac or coronary
anatomy or pathology. The coronary calcium score/coronary CTA
interpretation by the cardiologist is attached.
CLINICAL DATA: 66 Year old African American Male
Cardiac/Coronary  CTA
TECHNIQUE: The patient was scanned on a [employer] scanner.

[Series 2146: — · 0.45mm/px · 5 of 8 slices shown, 7 images]
[im 2/8  vessel]
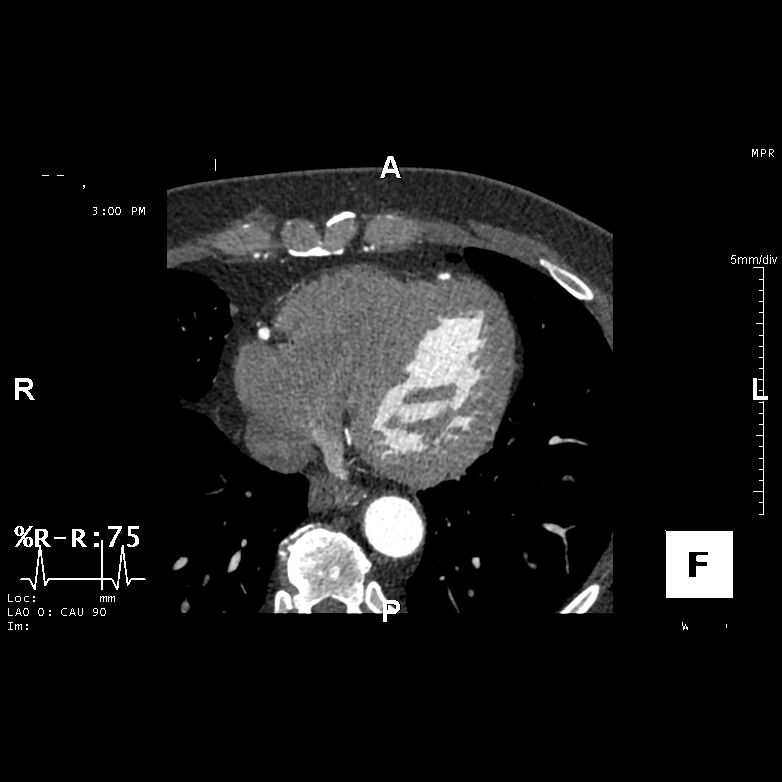
[im 2/8  lung]
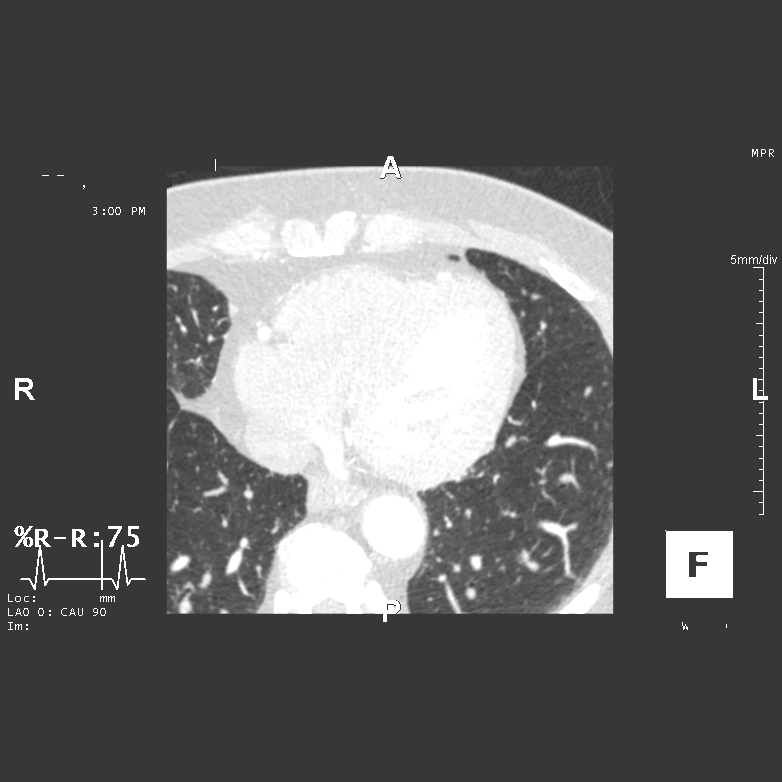
[im 3/8  vessel]
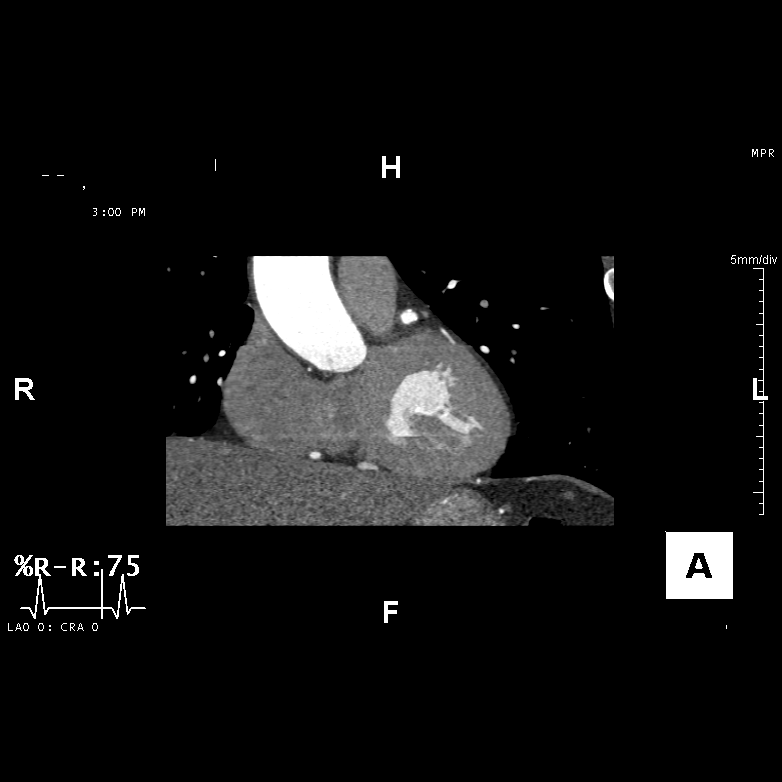
[im 5/8  vessel]
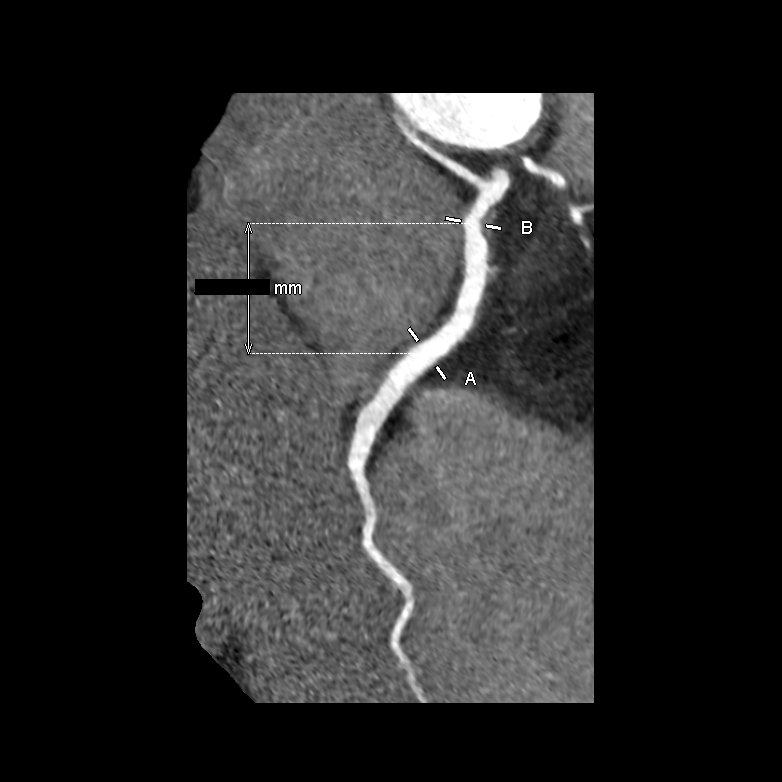
[im 6/8  vessel]
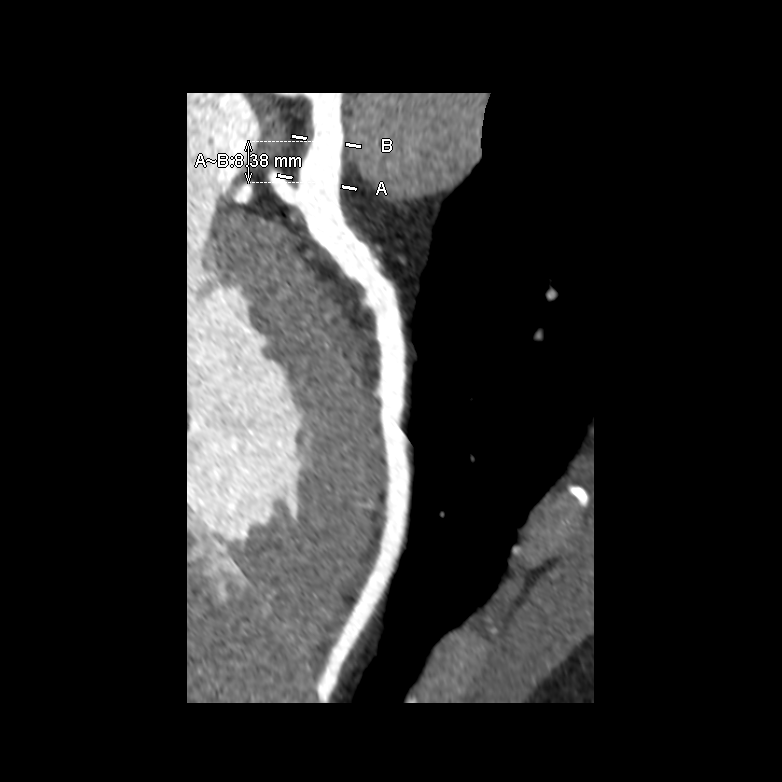
[im 7/8  vessel]
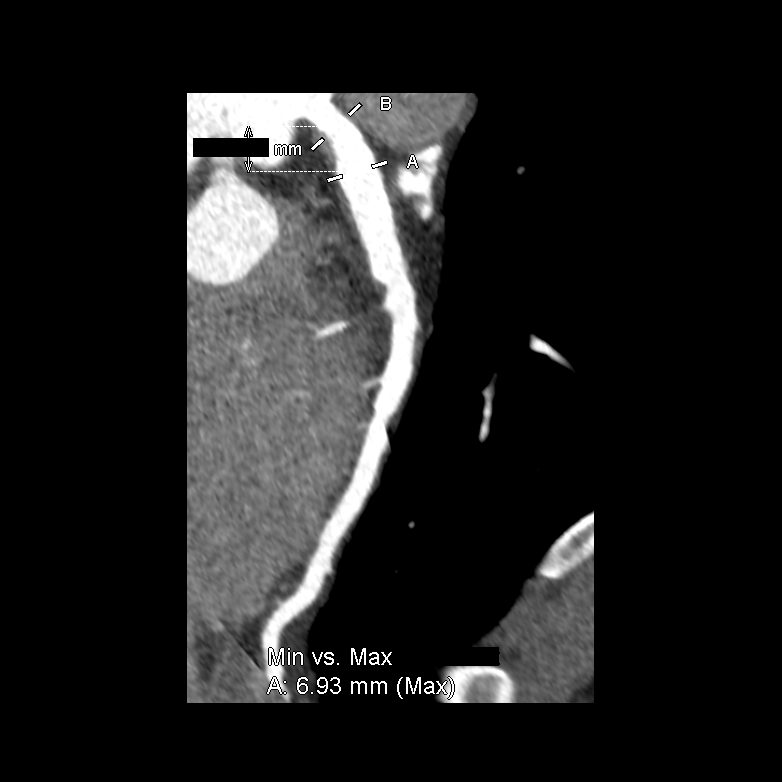
[im 7/8  lung]
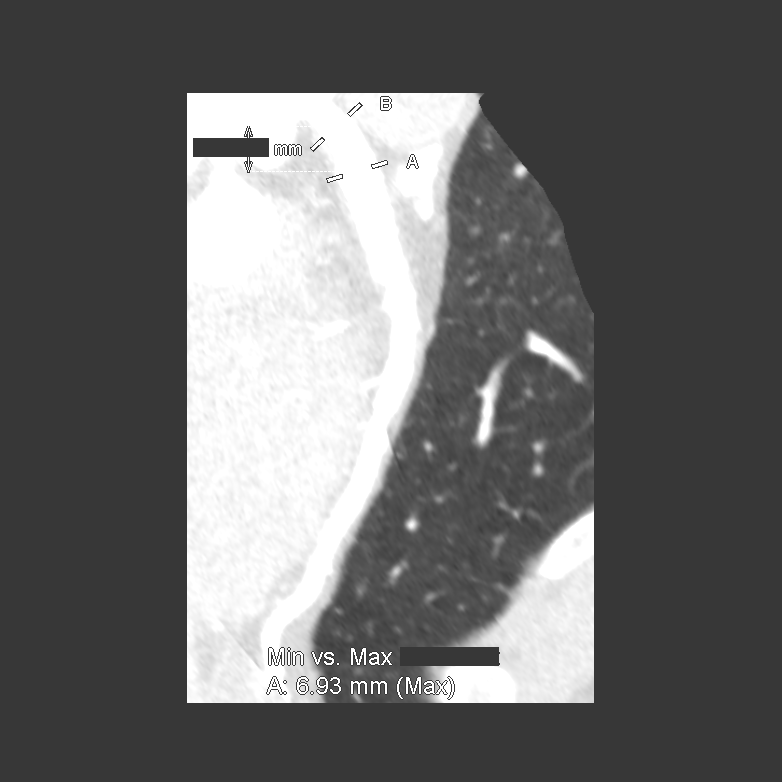

[5 of 16 positions shown; findings below may reference images not displayed]

FINDINGS: Limited view of the lung parenchyma demonstrates no suspicious
nodularity. Airways are normal.

Limited view of the mediastinum demonstrates no adenopathy.
Esophagus normal.

Limited view of the upper abdomen unremarkable.

Limited view of the skeleton and chest wall is unremarkable.
IMPRESSION: No significant extracardiac findings.
FINDINGS: Scan was triggered in the descending thoracic aorta. Axial
non-contrast 3 mm slices were carried out through the heart. The
data set was analyzed on a dedicated work station and scored using
the Agatson method. Gantry rotation speed was 250 msecs and
collimation was .6 mm. 0.8 mg of sl NTG was given. The 3D data set
was reconstructed in 5% intervals of the 67-82 % of the R-R cycle.
Diastolic phases were analyzed on a dedicated work station using
MPR, MIP and VRT modes. The patient received 95 cc of contrast.

Aorta:  Normal size.  No calcifications.  No dissection.

Aortic Valve:  Tri-leaflet.  Aortic valve calcium score 20.

Coronary Arteries:  Normal coronary origin.  Right dominance.

Coronary Calcium Score:

Left main: 0

Left anterior descending artery: 28

Left circumflex artery: 0

Right coronary artery: 0

Total: 28

Percentile: 55th for age, sex, and race matched control.

RCA is a large dominant artery that gives rise to PDA and PLA. Mild
soft plaque in proximal RCA. Minimal soft plaque in mid RCA.

Left main is a large artery that gives rise to LAD and LCX arteries.
Mild soft plaque in the mid vessel.

LAD is a large vessel that gives rise to multiple diagonal vessels.
Mild mixed plaque in proximal LAD. Mild mixed plaque in mid LAD.
Mild soft plaque in D1.

LCX is a non-dominant artery with two small OM vessels. There is no
significant plaque.

Other findings:

Normal pulmonary vein drainage into the left atrium.

Normal left atrial appendage without a thrombus.

Extra-cardiac findings: See attached radiology report for
non-cardiac structures.

Cardiac Motion slap and stair step artifact.
IMPRESSION: 1. Coronary calcium score of 28. This was 55th percentile for age,
sex, and race matched control.

2. Normal coronary origin with right dominance.

3. CAD-RADS 2. Mild non-obstructive CAD (25-49%). Consider
non-atherosclerotic causes of chest pain. Consider preventive
therapy and risk factor modification.

4. Aortic valve calcium score 20.

RECOMMENDATIONS:



If CAC = 0, it is reasonable to withhold statin therapy and reassess
in 5 to 10 years, as long as higher risk conditions are absent
(diabetes mellitus, family history of premature CHD in first degree
relatives (males <55 years; females <65 years), cigarette smoking,
LDL >=190 mg/dL or other independent risk factors).

If CAC is 1 to 99, it is reasonable to initiate statin therapy for
patients >=55 years of age.

If CAC is >=100 or >=75th percentile, it is reasonable to initiate
statin therapy at any age.

Cardiology referral should be considered for patients with CAC
scores =400 or >=75th percentile.

*8315 AHA/ACC/AACVPR/AAPA/ABC/LIZETTE/NATAMA/LOY/Won/RIFAAT/JESSEY/STONES
Guideline on the Management of Blood Cholesterol: A Report of the
American College of Cardiology/American Heart Association Task Force
on Clinical Practice Guidelines. J Am Coll Cardiol.
5379;73(24):8167-8136.

*** End of Addendum ***
EXAM:
OVER-READ INTERPRETATION  CT CHEST

The following report is an over-read performed by radiologist Dr.
Alfaia Matias Dawir [REDACTED] on 06/05/2021. This
over-read does not include interpretation of cardiac or coronary
anatomy or pathology. The coronary calcium score/coronary CTA
interpretation by the cardiologist is attached.
FINDINGS: Limited view of the lung parenchyma demonstrates no suspicious
nodularity. Airways are normal.

Limited view of the mediastinum demonstrates no adenopathy.
Esophagus normal.

Limited view of the upper abdomen unremarkable.

Limited view of the skeleton and chest wall is unremarkable.
IMPRESSION: No significant extracardiac findings.
